# Patient Record
Sex: Female | Born: 1970 | Race: White | Hispanic: No | Marital: Married | Smoking: Never smoker
Health system: Southern US, Community
[De-identification: ages and names within clinical notes are randomized; demographics above are authoritative.]

## PROBLEM LIST (undated history)

## (undated) DIAGNOSIS — G43909 Migraine, unspecified, not intractable, without status migrainosus: Secondary | ICD-10-CM

## (undated) DIAGNOSIS — T7840XA Allergy, unspecified, initial encounter: Secondary | ICD-10-CM

## (undated) DIAGNOSIS — F419 Anxiety disorder, unspecified: Secondary | ICD-10-CM

## (undated) DIAGNOSIS — M1990 Unspecified osteoarthritis, unspecified site: Secondary | ICD-10-CM

## (undated) HISTORY — DX: Unspecified osteoarthritis, unspecified site: M19.90

## (undated) HISTORY — DX: Migraine, unspecified, not intractable, without status migrainosus: G43.909

## (undated) HISTORY — DX: Allergy, unspecified, initial encounter: T78.40XA

## (undated) HISTORY — DX: Anxiety disorder, unspecified: F41.9

---

## 1997-08-01 ENCOUNTER — Other Ambulatory Visit: Admission: RE | Admit: 1997-08-01 | Discharge: 1997-08-01 | Payer: Self-pay | Admitting: Gynecology

## 1998-09-03 ENCOUNTER — Other Ambulatory Visit: Admission: RE | Admit: 1998-09-03 | Discharge: 1998-09-03 | Payer: Self-pay | Admitting: Gynecology

## 1999-12-15 ENCOUNTER — Other Ambulatory Visit: Admission: RE | Admit: 1999-12-15 | Discharge: 1999-12-15 | Payer: Self-pay | Admitting: Gynecology

## 2000-11-15 ENCOUNTER — Other Ambulatory Visit: Admission: RE | Admit: 2000-11-15 | Discharge: 2000-11-15 | Payer: Self-pay | Admitting: Gynecology

## 2001-06-15 ENCOUNTER — Encounter: Admission: RE | Admit: 2001-06-15 | Discharge: 2001-06-15 | Payer: Self-pay | Admitting: Internal Medicine

## 2001-06-15 ENCOUNTER — Encounter: Payer: Self-pay | Admitting: Internal Medicine

## 2001-10-23 ENCOUNTER — Other Ambulatory Visit: Admission: RE | Admit: 2001-10-23 | Discharge: 2001-10-23 | Payer: Self-pay | Admitting: Gynecology

## 2002-01-12 ENCOUNTER — Ambulatory Visit (HOSPITAL_COMMUNITY): Admission: RE | Admit: 2002-01-12 | Discharge: 2002-01-12 | Payer: Self-pay | Admitting: Unknown Physician Specialty

## 2002-01-12 ENCOUNTER — Encounter: Payer: Self-pay | Admitting: Oral & Maxillofacial Surgery

## 2002-05-23 ENCOUNTER — Ambulatory Visit (HOSPITAL_COMMUNITY): Admission: RE | Admit: 2002-05-23 | Discharge: 2002-05-23 | Payer: Self-pay | Admitting: Oral & Maxillofacial Surgery

## 2003-01-25 ENCOUNTER — Other Ambulatory Visit: Admission: RE | Admit: 2003-01-25 | Discharge: 2003-01-25 | Payer: Self-pay | Admitting: Gynecology

## 2003-11-22 ENCOUNTER — Ambulatory Visit: Payer: Self-pay | Admitting: Family Medicine

## 2003-11-27 ENCOUNTER — Ambulatory Visit: Payer: Self-pay | Admitting: Family Medicine

## 2004-02-10 ENCOUNTER — Other Ambulatory Visit: Admission: RE | Admit: 2004-02-10 | Discharge: 2004-02-10 | Payer: Self-pay | Admitting: Obstetrics & Gynecology

## 2004-03-03 ENCOUNTER — Ambulatory Visit (HOSPITAL_COMMUNITY): Admission: RE | Admit: 2004-03-03 | Discharge: 2004-03-03 | Payer: Self-pay | Admitting: Obstetrics & Gynecology

## 2004-04-13 ENCOUNTER — Ambulatory Visit: Payer: Self-pay | Admitting: Internal Medicine

## 2004-06-03 ENCOUNTER — Ambulatory Visit: Payer: Self-pay | Admitting: Internal Medicine

## 2004-06-05 ENCOUNTER — Ambulatory Visit: Payer: Self-pay | Admitting: Internal Medicine

## 2004-10-20 ENCOUNTER — Ambulatory Visit: Payer: Self-pay | Admitting: Internal Medicine

## 2005-02-22 ENCOUNTER — Other Ambulatory Visit: Admission: RE | Admit: 2005-02-22 | Discharge: 2005-02-22 | Payer: Self-pay | Admitting: Obstetrics & Gynecology

## 2005-03-03 ENCOUNTER — Ambulatory Visit: Payer: Self-pay | Admitting: Internal Medicine

## 2005-03-15 ENCOUNTER — Ambulatory Visit: Payer: Self-pay | Admitting: Internal Medicine

## 2005-06-14 ENCOUNTER — Ambulatory Visit: Payer: Self-pay | Admitting: Internal Medicine

## 2005-11-22 ENCOUNTER — Ambulatory Visit: Payer: Self-pay | Admitting: Internal Medicine

## 2005-12-07 ENCOUNTER — Ambulatory Visit: Payer: Self-pay | Admitting: Internal Medicine

## 2005-12-09 ENCOUNTER — Ambulatory Visit: Payer: Self-pay | Admitting: Cardiovascular Disease

## 2008-10-03 ENCOUNTER — Inpatient Hospital Stay (HOSPITAL_COMMUNITY): Admission: AD | Admit: 2008-10-03 | Discharge: 2008-10-03 | Payer: Self-pay | Admitting: Obstetrics & Gynecology

## 2008-10-28 ENCOUNTER — Inpatient Hospital Stay (HOSPITAL_COMMUNITY): Admission: RE | Admit: 2008-10-28 | Discharge: 2008-10-30 | Payer: Self-pay | Admitting: Obstetrics and Gynecology

## 2008-11-20 ENCOUNTER — Ambulatory Visit: Admission: RE | Admit: 2008-11-20 | Discharge: 2008-11-20 | Payer: Self-pay | Admitting: Obstetrics & Gynecology

## 2010-04-09 LAB — CBC
HCT: 34.5 % — ABNORMAL LOW (ref 36.0–46.0)
Hemoglobin: 12 g/dL (ref 12.0–15.0)
MCHC: 34.7 g/dL (ref 30.0–36.0)
MCV: 96.5 fL (ref 78.0–100.0)
Platelets: 254 10*3/uL (ref 150–400)
Platelets: 265 10*3/uL (ref 150–400)
RBC: 2.75 MIL/uL — ABNORMAL LOW (ref 3.87–5.11)
RBC: 3.57 MIL/uL — ABNORMAL LOW (ref 3.87–5.11)
RDW: 13.5 % (ref 11.5–15.5)
WBC: 11.4 10*3/uL — ABNORMAL HIGH (ref 4.0–10.5)
WBC: 18 10*3/uL — ABNORMAL HIGH (ref 4.0–10.5)

## 2010-04-09 LAB — RPR: RPR Ser Ql: NONREACTIVE

## 2010-04-10 LAB — PROTIME-INR
INR: 0.9 (ref 0.00–1.49)
Prothrombin Time: 12 seconds (ref 11.6–15.2)

## 2010-04-10 LAB — CBC
HCT: 33 % — ABNORMAL LOW (ref 36.0–46.0)
Hemoglobin: 11.4 g/dL — ABNORMAL LOW (ref 12.0–15.0)
MCV: 96.6 fL (ref 78.0–100.0)
Platelets: 266 10*3/uL (ref 150–400)
RBC: 3.41 MIL/uL — ABNORMAL LOW (ref 3.87–5.11)
WBC: 11.7 10*3/uL — ABNORMAL HIGH (ref 4.0–10.5)

## 2010-04-10 LAB — APTT: aPTT: 27 seconds (ref 24–37)

## 2010-05-22 NOTE — Consult Note (Signed)
[name], Hardy                       ACCOUNT NO.:  [id number]   MEDICAL RECORD NO.:  [id number]                   PATIENT TYPE:  AMB   LOCATION:  DAY                                  FACILITY:  [facility]   PHYSICIAN:  Dorthula Matas, D.D.S.           DATE OF BIRTH:  Jun 22, 1970   DATE OF CONSULTATION:  05/23/2002  DATE OF DISCHARGE:                                   CONSULTATION   DAY SURGICAL NOTE   HISTORY OF PRESENT ILLNESS:  Julie Hardy is a 40 year old female who  is well-known to me.  I have seen her multiple times in the office, and she  has had complaints of left temporomandibular joint pain and locking.  Her  interincisal opening is limited to anywhere from 20-33 mm, depending on the  day.  We have tried splint therapy, and also have tried anti-inflammatory  medications and muscle relaxants to try to alleviate her pain.  She has also  modified her diet, but has not had any significant relief from the pain.  She had a magnetic resonant scan which showed temporomandibular joint  displacement bilaterally, but again, since her complaints are mainly on the  left side, we have decided just to address the left side.  Prior to  consenting to perform surgery, I placed some local anesthetic in the left  joint, and did give her significant pain relief.  At this point, she has  decided to proceed with left temporomandibular joint diagnostic and surgical  arthroscopy, and this will be done at [facility] day care.  She  does have an allergy to PENICILLIN, but otherwise she is in good general  health.   Our plan is to do the left temporomandibular joint arthroscopy and then  follow her postoperatively with physical therapy.  She is aware that she  will have to be off eating solid foods for approximately eight weeks.  I  have also talked to her about the significant risks with this surgery which  include, but are not limited to, the following:  swelling,  bruising,  bleeding, continued pain, possible need for further surgical care of  conservative temporomandibular joint care, infection, ankylosis, or fibrosis  of the joint, continued degeneration of the joint which may require further  care, possible damage to the facial nerve which might cause some paralysis,  either temporary or long term, on the left side of the face, possible  hearing changes or hearing loss, possible puncture of the external auditory  canal or tympanic membrane.  The patient is aware of these risks and desires  to proceed with the planned surgical care, which again will be accomplished  today at [facility].  Dorthula Matas, D.D.S.    SWS/MEDQ  D:  05/23/2002  T:  05/23/2002  Job:  [id number]

## 2010-05-22 NOTE — Op Note (Signed)
Julie Hardy, Julie Hardy                       ACCOUNT NO.:  [id number]   MEDICAL RECORD NO.:  [id number]                   PATIENT TYPE:  AMB   LOCATION:  DAY                                  FACILITY:  [facility]   PHYSICIAN:  Dorthula Matas, D.D.S.           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   PROCEDURE:  Left temporomandibular joint arthroscopy with holmium laser  assist.   SURGEON:  Dorthula Matas, D.D.S.   ANESTHESIA:  General anesthesia.   SPECIMENS:  None.   CULTURES:  None.   DRAINS:  Pack at the portal site.   COMPLICATIONS:  None.   DESCRIPTION OF PROCEDURE:  The patient is brought to the OR , placed on the  OR table in the supine position.  She was then placed under general  anesthesia and a nasotracheal tube was inserted.  The patient did have some  nasal bleeding, but it was not excessive.  Once the tube was placed, the  patient was maintained under general anesthesia and was prepped and draped  in a sterile manner for an oral, maxillofacial temporomandibular joint  arthroscopy procedure.  Prior to prepping a line was scribed from the left  lateral canthus of the eye to the mid-tragus area.  The patient was prepped  and then prepped and laser pads were placed over her eyes.  The patient's  head was turned so that the left side was up.  I first had the mandible  manipulated and felt the lateral aspect of the zygomatic arch and coronoid  process as well as the medial epicondyle.  I then placed a mark at the 10  and 2 mark and modified it slightly based on the palpation of the condyle  moving.  I then insufflated the superior joint space with approximately 2 mL  of lactated Ringer's.  I got good back pressure.  I then made a 2-3 mm  incision of just the skin  in an inferior-superior manner so that the  incision was vertical at the 10/2 approximate mark.  I then spread this with  a small mosquito until I felt I was close to the lateral rim of the glenoid  fossa.  I then placed a trocar with a sharp obturator in and immediately did  feel the lateral rim of the glenoid fossa.  Using a scribing motion superior  to inferior, I dropped over the inferior edge of the glenoid fossa and  pushed through the lateral capsule.  I kept the trocar oriented in a 10  degree up and a 10 degree anterior angulation throughout this.  Once I was  through the lateral capsule, I removed the obturator, which was sharp, and  replaced it with a blunt obturator.  The trocar  was further inserted into  the superior joint space.  I then removed the blunt obturator and did some  back washing with heparinized lactated Ringer's of the superior joint  space.  I then placed a needle outflow catheter of 20 gauge size  approximately 5 mm anterior and 5 mm inferior to the previously-described  incision.  I was then able to get through-and-through irrigation.  I placed  a scope after it had been white-balanced and identified and inspected the  superior joint space.  There was extreme hyperemia of the posterior lamina  area and the disc was noted to be dislocated anteriorly.  When I went into  the anterior pouch, again there was hyperemia noted and some adhesive-type  areas.  Also, there was marked softening of the lining of the glenoid fossa  and there was synovitis also identified.  At this point using a vector  technique, I placed the second trocar.  This was done using a vectoring  technique, but it was approximately at the 27/7 mark.  Again a 2-3 mm  vertical incision just through the skin was made and I then spread through  the tissue down to the articular eminence region using a mosquito hemostat.  I then placed the trocar with a sharp obturator, again encountered the  lateral  rim of the eminence or slightly anterior to the eminence, and then  using a scribing motion went beneath this bony landmark and pushed through  the lateral capsule into the superior joint space.  I could feel the other  scope as I advanced and once I was through the lateral capsule removed the  sharp obturator and placed the blunt obturator.  The scope was further  inserted.  The blunt obturator was removed, and I noted through-and-through  irrigation through the second trocar.  I placed a blunt switching stick and  was able to identify the placement of the second trocar.  Once this was  done, it was noted that there was a lot of inflammation in this anterior  pouch region and the holmium laser was placed.  I used the holmium laser at  different power settings to remove some of the synovium as well as to  decrease the hyperemia which was identified.  I then removed the laser and  placed the switching stick.  I then went back to the posterior pouch area.  Again the hyperemia was then addressed using the laser at a 0.2-0.5 joule  setting at approximately eight pulses.  Again the laser was used to reduce  the hyperemia and again also had some areas of loose bodies, which were  obliterated using the laser, and also some synovium where I had to do some  synovectomy.  Once this was completed, I did a lot of through-and-through  irrigation and I also found some adhesions, which I lysed using the laser,  and also with the switching stick.  I then copiously irrigated the joint  space and placed a milliliter of Celestone in the superior joint space.  I  removed both trocars and applied pressure laterally for a minute or two  prior to placing the interrupted sutures of 6-0 nylon.  In the anterior  trocar incision I placed one suture, in the posterior trocar incision I  placed two interrupted sutures.  At this point bacitracin ointment was applied.  I irrigated the external auditory canal with some  peroxide gently  and then with alcohol to dry it.  I inspected the external auditory  canal  and it was intact, and no bleeding  was noted in the external auditory  canal.  The tympanic membrane was difficult to visualize due to some cerumen  which was present.  At this point I placed Band-Aids over the incision  sites.  I then removed the drapes and manipulated the mandible.  The  mandible manipulated to approximately 44 of intra-incisal opening without  difficulty.  We suctioned out the oropharynx.  The small external auditory  canal cotton pellet had been removed previously.  The patient was awakened  in the OR and was transferred to the PACU area for follow-up and eventual  discharge from the day surgical area.                                               Dorthula Matas, D.D.S.    SWS/MEDQ  D:  05/23/2002  T:  05/23/2002  Job:  [id number]

## 2011-09-05 ENCOUNTER — Ambulatory Visit (INDEPENDENT_AMBULATORY_CARE_PROVIDER_SITE_OTHER): Payer: BC Managed Care – PPO | Admitting: Internal Medicine

## 2011-09-05 ENCOUNTER — Encounter: Payer: Self-pay | Admitting: Emergency Medicine

## 2011-09-05 VITALS — BP 137/77 | HR 69 | Temp 98.1°F | Resp 16 | Ht 65.0 in | Wt 146.0 lb

## 2011-09-05 DIAGNOSIS — R21 Rash and other nonspecific skin eruption: Secondary | ICD-10-CM

## 2011-09-05 DIAGNOSIS — B373 Candidiasis of vulva and vagina: Secondary | ICD-10-CM

## 2011-09-05 DIAGNOSIS — Z202 Contact with and (suspected) exposure to infections with a predominantly sexual mode of transmission: Secondary | ICD-10-CM

## 2011-09-05 DIAGNOSIS — IMO0002 Reserved for concepts with insufficient information to code with codable children: Secondary | ICD-10-CM

## 2011-09-05 LAB — POCT WET PREP WITH KOH
KOH Prep POC: POSITIVE
Trichomonas, UA: NEGATIVE
Yeast Wet Prep HPF POC: NEGATIVE

## 2011-09-05 MED ORDER — VALACYCLOVIR HCL 1 G PO TABS: 1000.0000 mg | ORAL_TABLET | Freq: Two times a day (BID) | ORAL | Status: AC

## 2011-09-05 MED ORDER — FLUCONAZOLE 150 MG PO TABS: 150.0000 mg | ORAL_TABLET | Freq: Once | ORAL | Status: AC

## 2011-09-05 MED ORDER — FLUOCINONIDE-E 0.05 % EX CREA: TOPICAL_CREAM | Freq: Two times a day (BID) | CUTANEOUS | Status: AC

## 2011-09-05 NOTE — Progress Notes (Signed)
  Subjective:    Patient ID: Julie Hardy, female    DOB: 1970-10-06, 41 y.o.   MRN: [id number]  HPIComplaining of vaginal swelling with irritation and itching and burning sensation since intercourse 5 days ago. 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 Review of Systems No fever chills or flank pain   No abdominal pain or pelvic pain Objective:   Physical Exam Vital signs stable without fever Introitus with red labia that are puffy without discrete lesions/ cottage cheese discharge Small irritation above clitoris it is very tender to scraping/not a clear ulceration  and no inguinal nodes     Results for orders placed in visit on 09/05/11  POCT WET PREP WITH KOH      Component Value Range   Trichomonas, UA Negative     Clue Cells Wet Prep HPF POC 0-1     Epithelial Wet Prep HPF POC 8-12     Yeast Wet Prep HPF POC neg     Bacteria Wet Prep HPF POC 3+     RBC Wet Prep HPF POC 0-2     WBC Wet Prep HPF POC 7-10     KOH Prep POC Positive      Assessment & Plan:  Problem #1 yeast vaginitis not responding to therapy-Will take 3 days in row Diflucan 150 Problem #2 painful skin lesion-HSV culture Problem #3 dyspareunia/lubrication/long-term= referred to [facility]"  To A&E for Lube  Meds ordered this encounter  Medications  . fluconazole (DIFLUCAN) 150 MG tablet    Sig: Take 1 tablet (150 mg total) by mouth once.    Dispense:  1 tablet    Refill:  0  . fluocinonide-emollient (LIDEX-E) 0.05  % cream    Sig: Apply topically 2 (two) times daily.    Dispense:  30 g    Refill:  0  . valACYclovir (VALTREX) 1000 MG tablet    Sig: Take 1 tablet (1,000 mg total) by mouth 2 (two) times daily.    Dispense:  14 tablet    Refill:  0   Call results

## 2011-09-07 LAB — GC/CHLAMYDIA PROBE AMP, URINE
Chlamydia, Swab/Urine, PCR: NEGATIVE
GC Probe Amp, Urine: NEGATIVE

## 2011-09-08 LAB — HERPES SIMPLEX VIRUS CULTURE: Organism ID, Bacteria: NOT DETECTED

## 2012-05-22 LAB — CBC AND DIFFERENTIAL
HCT: 41 (ref 36–46)
Hemoglobin: 13.5 (ref 12.0–16.0)
Neutrophils Absolute: 5
PLATELETS: 267 (ref 150–399)
WBC: 8.5

## 2012-05-22 LAB — BASIC METABOLIC PANEL
BUN: 13 (ref 4–21)
CREATININE: 0.6 (ref 0.5–1.1)
GLUCOSE: 52
Potassium: 4.9 (ref 3.4–5.3)
Sodium: 140 (ref 137–147)

## 2012-05-22 LAB — HEPATIC FUNCTION PANEL
ALK PHOS: 55 (ref 25–125)
ALT: 14 (ref 7–35)
AST: 14 (ref 13–35)
Bilirubin, Total: 0.4

## 2012-05-31 ENCOUNTER — Other Ambulatory Visit: Payer: Self-pay | Admitting: Gastroenterology

## 2012-05-31 DIAGNOSIS — K219 Gastro-esophageal reflux disease without esophagitis: Secondary | ICD-10-CM

## 2012-06-13 ENCOUNTER — Ambulatory Visit
Admission: RE | Admit: 2012-06-13 | Discharge: 2012-06-13 | Disposition: A | Discharge status: Home / Self Care | Payer: BC Managed Care – PPO | Source: Ambulatory Visit | Attending: Gastroenterology | Admitting: Gastroenterology

## 2012-06-13 DIAGNOSIS — K219 Gastro-esophageal reflux disease without esophagitis: Secondary | ICD-10-CM

## 2017-03-11 ENCOUNTER — Emergency Department (HOSPITAL_COMMUNITY)
Admission: EM | Admit: 2017-03-11 | Discharge: 2017-03-11 | Disposition: A | Discharge status: Home / Self Care | Payer: BC Managed Care – PPO | Attending: Emergency Medicine | Admitting: Emergency Medicine

## 2017-03-11 ENCOUNTER — Encounter (HOSPITAL_COMMUNITY): Payer: Self-pay | Admitting: Emergency Medicine

## 2017-03-11 ENCOUNTER — Emergency Department (HOSPITAL_COMMUNITY): Payer: BC Managed Care – PPO

## 2017-03-11 ENCOUNTER — Other Ambulatory Visit: Payer: Self-pay

## 2017-03-11 DIAGNOSIS — R0789 Other chest pain: Secondary | ICD-10-CM | POA: Diagnosis not present

## 2017-03-11 DIAGNOSIS — R5383 Other fatigue: Secondary | ICD-10-CM | POA: Diagnosis not present

## 2017-03-11 DIAGNOSIS — R5381 Other malaise: Secondary | ICD-10-CM | POA: Diagnosis not present

## 2017-03-11 LAB — BASIC METABOLIC PANEL
Anion gap: 7 (ref 5–15)
BUN: 16 mg/dL (ref 6–20)
CALCIUM: 9.1 mg/dL (ref 8.9–10.3)
CO2: 27 mmol/L (ref 22–32)
Chloride: 107 mmol/L (ref 101–111)
Creatinine, Ser: 0.87 mg/dL (ref 0.44–1.00)
GFR calc Af Amer: >60 mL/min (ref >60)
Glucose, Bld: 83 mg/dL (ref 65–99)
Potassium: 4 mmol/L (ref 3.5–5.1)
SODIUM: 141 mmol/L (ref 135–145)

## 2017-03-11 LAB — I-STAT BETA HCG BLOOD, ED (MC, WL, AP ONLY): I-stat hCG, quantitative: <5 m[IU]/mL (ref <5)

## 2017-03-11 LAB — CBC
HCT: 37.2 % (ref 36.0–46.0)
Hemoglobin: 12.8 g/dL (ref 12.0–15.0)
MCH: 31.9 pg (ref 26.0–34.0)
MCHC: 34.4 g/dL (ref 30.0–36.0)
MCV: 92.8 fL (ref 78.0–100.0)
Platelets: 246 10*3/uL (ref 150–400)
RBC: 4.01 MIL/uL (ref 3.87–5.11)
RDW: 12.5 % (ref 11.5–15.5)
WBC: 8.8 10*3/uL (ref 4.0–10.5)

## 2017-03-11 LAB — I-STAT TROPONIN, ED: TROPONIN I, POC: 0 ng/mL (ref 0.00–0.08)

## 2017-03-11 MED ORDER — KETOROLAC TROMETHAMINE 15 MG/ML IJ SOLN
15.0000 mg | Freq: Once | INTRAMUSCULAR | Status: AC
  Administered 2017-03-11: 15 mg via INTRAVENOUS
  Filled 2017-03-11: qty 1

## 2017-03-11 NOTE — ED Notes (Signed)
Patient given discharge instructions and verbalized understanding.  Patient stable to discharge at this time.  Patient is alert and oriented to baseline.  No distressed noted at this time.  All belongings taken with the patient at discharge.   

## 2017-03-11 NOTE — ED Triage Notes (Signed)
Patient arrived to triage via [facility]. Started having chest pain this morning before work and it got worse throughout the day. Also c/o of SHOB, lightheaded.

## 2017-03-11 NOTE — ED Notes (Signed)
[facility]

## 2017-03-11 NOTE — ED Provider Notes (Signed)
[facility] EMERGENCY DEPARTMENT Provider Note   CSN: [id number] Arrival date & time: 03/11/17  1508     History   Chief Complaint Chief Complaint  Patient presents with  . Chest Pain    HPI Julie Hardy is a 47 y.o. female.  47 year old female without significant medical history presents with complaint of malaise and fatigue times 3 weeks.  Patient reports that she has been feeling tired and under the weather for the last months.  She is employed as a principal of an Engineer, petroleum.  She has multiple exposures to sick children on a daily basis.  Today she noticed vague left parasternal chest discomfort.  This is been present all day.  The pain is dull and constant.  It is worse with a deep breath or movement of the chest wall.  She denies associated shortness of breath, nausea, vomiting, diaphoresis, or other acute complaint.     The history is provided by the patient.  Chest Pain   The current episode started 12 to 24 hours ago. The problem occurs rarely. The problem has not changed since onset.The pain is associated with movement. Pain location: left parasternal costochondral margin  The pain is mild. The quality of the pain is described as pressure-like. The pain does not radiate. The symptoms are aggravated by certain positions. She has tried nothing for the symptoms. There are no known risk factors.    History reviewed. No pertinent past medical history.  There are no active problems to display for this patient.   History reviewed. No pertinent surgical history.  OB History    No data available       Home Medications    Prior to Admission medications   Medication Sig Start Date End Date Taking? Authorizing Provider  Artificial Tear Ointment (DRY EYES OP) Apply 1 drop to eye as needed.   Yes [provider]  ibuprofen (ADVIL) 200 MG tablet Take 400 mg by mouth at bedtime.   Yes [provider]  loratadine (CLARITIN) 10  MG tablet Take 10 mg by mouth at bedtime.   Yes [provider]    Family History History reviewed. No pertinent family history.  Social History Social History   Tobacco Use  . Smoking status: Never Smoker  . Smokeless tobacco: Never Used  Substance Use Topics  . Alcohol use: Yes    Alcohol/week: 4.2 oz    Types: 7 Glasses of wine per week    Comment: social  . Drug use: No     Allergies   Amoxicillin and Chocolate flavor   Review of Systems Review of Systems  Constitutional: Positive for fatigue.  Cardiovascular: Positive for chest pain.  All other systems reviewed and are negative.    Physical Exam Updated Vital Signs BP 131/86 (BP Location: Right Arm)   Pulse 67   Temp 98.6 F (37 C) (Oral)   Resp 16   Ht 5\' 4"  (1.626 m)   Wt 68.9 kg (152 lb)   SpO2 100%   BMI 26.09 kg/m   Physical Exam  Constitutional: She is oriented to person, place, and time. She appears well-developed and well-nourished. No distress.  HENT:  Head: Normocephalic and atraumatic.  Mouth/Throat: Oropharynx is clear and moist.  Eyes: Conjunctivae and EOM are normal. Pupils are equal, round, and reactive to light.  Neck: Normal range of motion. Neck supple.  Cardiovascular: Normal rate, regular rhythm and normal heart sounds.  Tenderness to the left parasternal costochondral  margin. This reproduces the patient's reported pain exactly.  Pulmonary/Chest: Effort normal and breath sounds normal. No respiratory distress.  Abdominal: Soft. She exhibits no distension. There is no tenderness.  Musculoskeletal: Normal range of motion. She exhibits no edema or deformity.  Neurological: She is alert and oriented to person, place, and time.  Skin: Skin is warm and dry.  Psychiatric: She has a normal mood and affect.  Nursing note and vitals reviewed.    ED Treatments / Results  Labs (all labs ordered are listed, but only abnormal results are displayed) Labs Reviewed  BASIC  METABOLIC PANEL  CBC  TROPONIN I  TSH  I-STAT TROPONIN, ED  I-STAT BETA HCG BLOOD, ED ([facility], [facility], [facility] ONLY)  I-STAT TROPONIN, ED    EKG  EKG Interpretation  Date/Time:  Friday March 11 2017 15:13:19 EST Ventricular Rate:  78 PR Interval:  138 QRS Duration: 64 QT Interval:  360 QTC Calculation: 410 R Axis:   22 Text Interpretation:  Normal sinus rhythm Normal ECG Confirmed by Kristine RoyalMessick, Peter [phone]([id number]) on 03/11/2017 5:53:21 PM       Radiology Dg Chest 2 View  Result Date: 03/11/2017 CLINICAL DATA:  47 y/o F; worsening chest pain, shortness of breath, lightheadedness. EXAM: CHEST - 2 VIEW COMPARISON:  None. FINDINGS: The heart size and mediastinal contours are within normal limits. Both lungs are clear. The visualized skeletal structures are unremarkable. IMPRESSION: No acute pulmonary process identified. Electronically Signed   By: Mitzi HansenLance  Furusawa-Stratton M.D.   On: 03/11/2017 16:09    Procedures Procedures (including critical care time)  Medications Ordered in ED Medications  ketorolac (TORADOL) 15 MG/ML injection 15 mg (15 mg Intravenous Given 03/11/17 1831)     Initial Impression / Assessment and Plan / ED Course  I have reviewed the triage vital signs and the nursing notes.  Pertinent labs & imaging results that were available during my care of the patient were reviewed by me and considered in my medical decision making (see chart for details).     MDM  Screen complete  Patient is presenting with atypical chest pain.  The pain is reproduced with palpation of the left costochondral margin.  EKG is normal without suggestion of acute ischemia.  Chest x-ray is without significant findings.  Screening laboratory evaluations do not suggest other acute pathologies.  Patient's presentation is not suggestive of ACS.  Patient feels improved following administration of Toradol in the ED.  She desires discharge home.  She declines further observation or workup at this time.  Close  follow-up is advised.  Strict return precautions given and understood.   Patient understands a TSH screen is still pending - she desires discharge home and does not desire to wait for this result.  She will contact her PMD on Monday in order to obtain the results of this test.   Final Clinical Impressions(s) / ED Diagnoses   Final diagnoses:  Atypical chest pain  Chest wall pain    ED Discharge Orders    None       Wynetta FinesMessick, Peter C, MD 03/11/17 1940

## 2017-03-14 ENCOUNTER — Telehealth: Payer: Self-pay

## 2017-03-14 NOTE — Telephone Encounter (Signed)
-----   Message from Wendall StadePeter C Nishan, MD sent at 03/11/2017  5:14 PM EST ----- Have her see me as new patient ASAP seen in ER 03/11/17 for chest pain dad is a patient of mine

## 2017-03-14 NOTE — Telephone Encounter (Signed)
Left message for patient to call back  

## 2017-03-14 NOTE — Telephone Encounter (Signed)
Patient has an appointment tomorrow with Dr. Eden EmmsNishan.

## 2017-03-14 NOTE — Progress Notes (Signed)
Cardiology Office Note   Date:  03/15/2017   ID:  Julie LopeMeredith E Cantero, DOB 12/28/1970, MRN [id number]  PCP:  Dorothyann PengSanders, Robyn, MD  Cardiologist:   Charlton HawsPeter Marliyah Reid, MD   No chief complaint on file.     History of Present Illness: Julie Hardy is a 47 y.o. female who presents for consultation regarding chest pain referred by Kristine RoyalPeter Messick Treasure. Patient's father Mr Ida RogueKenneth Roberson is a patient of mine She was seen in ER 03/11/17 for atypical chest pain. 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 Non smoker no real PMH. Drinks a daily glass of wine ER notes indicate pain reproducible to palpation of left costochondral margin Rx with Toradol in ED with improvement R/O with normal CBC and BMET  CXR NAD ECG normal   Pain seems more pleuritic now. She takes two motrin routinely at night. Has one daughter age 268 Married 16 years no excess ETOH Non smoker Due to see primary Has severe fatigue     History reviewed. No pertinent past medical history.  History reviewed. No pertinent surgical history.   Current Outpatient Medications  Medication Sig Dispense Refill  . Artificial Tear Ointment (DRY EYES OP) Apply 1 drop to eye as needed.    Marland Kitchen. ibuprofen (ADVIL) 200 MG tablet Take 400 mg by mouth at bedtime.    Marland Kitchen. loratadine (CLARITIN) 10 MG tablet Take 10 mg by mouth at bedtime.     No current facility-administered medications for this visit.     Allergies:   Amoxicillin and Chocolate flavor    Social History:  The patient  reports that  has never smoked. she has never used smokeless tobacco. She reports that she drinks about 4.2 oz of alcohol per week. She reports that she does not use drugs.   Family History:  The patient's family history is not on file.    ROS:  Please see the history of present illness.   Otherwise, review of systems are positive for none.   All other  systems are reviewed and negative.    PHYSICAL EXAM: VS:  BP 128/80   Pulse 68   Ht 5\' 4"  (1.626 m)   Wt 153 lb (69.4 kg)   BMI 26.26 kg/m  , BMI Body mass index is 26.26 kg/m. Affect appropriate Healthy:  appears stated age HEENT: normal Neck supple with no adenopathy JVP normal no bruits no thyromegaly Lungs clear with no wheezing and good diaphragmatic motion Heart:  S1/S2 no murmur, no rub, gallop or click PMI normal Abdomen: benighn, BS positve, no tenderness, no AAA no bruit.  No HSM or HJR Distal pulses intact with no bruits No edema Neuro non-focal Skin warm and dry No muscular weakness    EKG:  03/12/17 NSR rate 78 normal ECG    Recent Labs: 03/11/2017: BUN 16; Creatinine, Ser 0.87; Hemoglobin 12.8; Platelets 246; Potassium 4.0; Sodium 141    Lipid Panel No results found for: CHOL, TRIG, HDL, CHOLHDL, VLDL, LDLCALC, LDLDIRECT    Wt Readings from Last 3 Encounters:  03/15/17 153 lb (69.4 kg)  03/11/17 152 lb (68.9 kg)  09/05/11 146 lb (66.2 kg)      Other studies Reviewed: Additional studies/ records that were reviewed today include: ER notes , CXR, labs and ECG .    ASSESSMENT AND PLAN:  1.Chest Pain:  Atypical ER evaluation normal Normal exam and  ECG f/u ETT 2. Anxiety/Stress: related to job f/u primary stress relief measures other than ETOH 3. Fatigue CBC/BMET normal make sure primary checks TSH/T4    Current medicines are reviewed at length with the patient today.  The patient does not have concerns regarding medicines.  The following changes have been made:  no change  Labs/ tests ordered today include: ETT   Orders Placed This Encounter  Procedures  . EXERCISE TOLERANCE TEST (ETT)     Disposition:   FU with cardiology PRN      Signed, Charlton Haws, MD  03/15/2017 3:00 PM    [facility] [address] [place], [place], [place]  [address] Phone: [phone]; Fax: [phone]

## 2017-03-15 ENCOUNTER — Encounter: Payer: Self-pay | Admitting: Cardiovascular Disease

## 2017-03-15 ENCOUNTER — Ambulatory Visit: Payer: BC Managed Care – PPO | Admitting: Cardiovascular Disease

## 2017-03-15 VITALS — BP 128/80 | HR 68 | Ht 64.0 in | Wt 153.0 lb

## 2017-03-15 DIAGNOSIS — R079 Chest pain, unspecified: Secondary | ICD-10-CM | POA: Diagnosis not present

## 2017-03-15 NOTE — Patient Instructions (Addendum)
Medication Instructions:  Your physician recommends that you continue on your current medications as directed. Please refer to the Current Medication list given to you today.  Labwork: NONE  Testing/Procedures: Your physician has requested that you have an exercise tolerance test. For further information please visit [email or web address]. Please also follow instruction sheet, as given.  Follow-Up: Your physician wants you to follow-up as needed with Dr. Nishan.    If you need a refill on your cardiac medications before your next appointment, please call your pharmacy.    

## 2017-03-17 ENCOUNTER — Encounter: Payer: Self-pay | Admitting: Family Medicine

## 2017-03-17 ENCOUNTER — Ambulatory Visit: Payer: BC Managed Care – PPO | Admitting: Family Medicine

## 2017-03-17 VITALS — BP 110/80 | HR 87 | Ht 64.0 in | Wt 154.8 lb

## 2017-03-17 DIAGNOSIS — E559 Vitamin D deficiency, unspecified: Secondary | ICD-10-CM | POA: Insufficient documentation

## 2017-03-17 DIAGNOSIS — F439 Reaction to severe stress, unspecified: Secondary | ICD-10-CM | POA: Insufficient documentation

## 2017-03-17 DIAGNOSIS — R0683 Snoring: Secondary | ICD-10-CM

## 2017-03-17 DIAGNOSIS — F5101 Primary insomnia: Secondary | ICD-10-CM

## 2017-03-17 MED ORDER — TRAZODONE HCL 50 MG PO TABS: 25.0000 mg | ORAL_TABLET | Freq: Every evening | ORAL | 3 refills | Status: DC | PRN

## 2017-03-17 MED ORDER — VITAMIN D (ERGOCALCIFEROL) 1.25 MG (50000 UNIT) PO CAPS: 50000.0000 [IU] | ORAL_CAPSULE | ORAL | 0 refills | Status: DC

## 2017-03-17 NOTE — Progress Notes (Addendum)
Subjective:  Patient ID: Julie Hardy, female    DOB: 1971/01/02  Age: 47 y.o. MRN: [id number]  CC: New Patient (Initial Visit)   HPI Julie Hardy presents for follow-up status post ER visit for acute chest pain 1 week ago.  EKG and serial troponins were negative.  She was diagnosed with costochondritis.  She followed up with a cardiologist 3 days later.  EKG performed in his office was normal as well.  ETT has been scheduled for next week.  Patient is accompanied by her 70 year old mother who is a breast cancer survivor.  Diabetes and osteo arthritis and hypothyroidism one on the mother side.   father's health history is unknown.  Patient brings in lab work drawn by her GYN provider.  CMP and CBC were normal.  Hemoglobin A1c was 5.1.  Patient has no history of elevated glucose but diabetes needed to be ruled out secondary to fatigue.  LDL cholesterol was 99 HDL of 60.  TSH was 1.78.  Vitamin D was low at 21.7.  Patient tells of poor sleep quality for quite some time now.  She typically sleeps no more than 3-4 hours a night.  She does snore some and does not feel rested when she does get up in the morning.  Her husband is a heavy snorer.  Elementary school principal.  She is the mother of an 15-year-old daughter.  Things are okay at home but she does admit that she is responsible for most of the household chores basis.  She has 1 glass of wine daily.  She does not smoke or use illicit drugs.  She averages 12 thousand steps daily while on her job.  She admits to an overwhelming sense of fatigue.  Patient's mother tells me that patient has fallen asleep in her car in patient's driveway.  Recent Pap and pelvic exams have been normal.  Recent mammogram was normal.  Outpatient Medications Prior to Visit  Medication Sig Dispense Refill  . ibuprofen (ADVIL) 200 MG tablet Take 400 mg by mouth at bedtime.    Marland Kitchen loratadine (CLARITIN) 10 MG tablet Take 10 mg by mouth at bedtime.    . Artificial Tear  Ointment (DRY EYES OP) Apply 1 drop to eye as needed.     No facility-administered medications prior to visit.     ROS Review of Systems  Constitutional: Positive for fatigue. Negative for activity change, chills, fever and unexpected weight change.  HENT: Negative.   Eyes: Negative.   Respiratory: Negative.   Cardiovascular: Negative.   Gastrointestinal: Negative.  Negative for abdominal pain, blood in stool, nausea and vomiting.  Genitourinary: Negative.   Musculoskeletal: Negative.   Skin: Negative for color change and rash.  Allergic/Immunologic: Negative for immunocompromised state.  Hematological: Does not bruise/bleed easily.  Psychiatric/Behavioral: Positive for sleep disturbance. Negative for self-injury. The patient is nervous/anxious.     Objective:  BP 110/80 (BP Location: Right Arm, Patient Position: Sitting, Cuff Size: Normal)   Pulse 87   Ht 5\' 4"  (1.626 m)   Wt 154 lb 12.8 oz (70.2 kg)   BMI 26.57 kg/m   BP Readings from Last 3 Encounters:  03/17/17 110/80  03/15/17 128/80  03/11/17 (!) 145/96    Wt Readings from Last 3 Encounters:  03/17/17 154 lb 12.8 oz (70.2 kg)  03/15/17 153 lb (69.4 kg)  03/11/17 152 lb (68.9 kg)    Physical Exam  Constitutional: She is oriented to person, place, and time. She appears well-developed and  well-nourished. No distress.  HENT:  Head: Normocephalic and atraumatic.  Right Ear: External ear normal.  Left Ear: External ear normal.  Mouth/Throat: Oropharynx is clear and moist. No oropharyngeal exudate.    Eyes: Conjunctivae are normal. Pupils are equal, round, and reactive to light. Right eye exhibits no discharge. Left eye exhibits no discharge. No scleral icterus.  Neck: Neck supple. No JVD present. No tracheal deviation present. No thyromegaly present.  Cardiovascular: Normal rate, regular rhythm and normal heart sounds.  Pulmonary/Chest: Effort normal and breath sounds normal. No stridor.  Abdominal: Bowel sounds  are normal. She exhibits no distension. There is no tenderness. There is no rebound and no guarding.  Lymphadenopathy:    She has no cervical adenopathy.  Neurological: She is alert and oriented to person, place, and time.  Skin: Skin is warm and dry. She is not diaphoretic.  Psychiatric: She has a normal mood and affect. Her behavior is normal.   Dep[facility] screen [facility] 03/17/2017  Decreased Interest 0  Down, Depressed, Hopeless 1  PHQ - 2 Score 1  Altered sleeping 3  Tired, decreased energy 3  Change in appetite 2  Feeling bad or failure about yourself  1  Trouble concentrating 1  Moving slowly or fidgety/restless 1  Suicidal thoughts 0  PHQ-9 Score 12    Lab Results  Component Value Date   WBC 8.8 03/11/2017   HGB 12.8 03/11/2017   HCT 37.2 03/11/2017   PLT 246 03/11/2017   GLUCOSE 83 03/11/2017   NA 141 03/11/2017   K 4.0 03/11/2017   CL 107 03/11/2017   CREATININE 0.87 03/11/2017   BUN 16 03/11/2017   CO2 27 03/11/2017   INR 0.9 10/03/2008    Dg Chest 2 View  Result Date: 03/11/2017 CLINICAL DATA:  47 y/o F; worsening chest pain, shortness of breath, lightheadedness. EXAM: CHEST - 2 VIEW COMPARISON:  None. FINDINGS: The heart size and mediastinal contours are within normal limits. Both lungs are clear. The visualized skeletal structures are unremarkable. IMPRESSION: No acute pulmonary process identified. Electronically Signed   By: Mitzi HansenLance  Furusawa-Stratton M.D.   On: 03/11/2017 16:09    Assessment & Plan:   Julie Hardy was seen today for new patient (initial visit).  Diagnoses and all orders for this visit:  Stress -     Urinalysis, Routine w reflex microscopic  Snores -     Ambulatory referral to Sleep Studies  Vitamin D deficiency -     Vitamin D, Ergocalciferol, (DRISDOL) 50000 units CAPS capsule; Take 1 capsule (50,000 Units total) by mouth every 7 (seven) days.  Primary insomnia -     traZODone (DESYREL) 50 MG tablet; Take 0.5-1 tablets (25-50 mg  total) by mouth at bedtime as needed for sleep.   I am having Julie Hardy start on Vitamin D (Ergocalciferol) and traZODone. I am also having her maintain her ibuprofen, loratadine, and Artificial Tear Ointment (DRY EYES OP).  Meds ordered this encounter  Medications  . Vitamin D, Ergocalciferol, (DRISDOL) 50000 units CAPS capsule    Sig: Take 1 capsule (50,000 Units total) by mouth every 7 (seven) days.    Dispense:  30 capsule    Refill:  0  . traZODone (DESYREL) 50 MG tablet    Sig: Take 0.5-1 tablets (25-50 mg total) by mouth at bedtime as needed for sleep.    Dispense:  30 tablet    Refill:  3   Patient did have a significant PHQ 9 score I do  believe that the stress of her job coupled with poor sleep could have a lot to do with her current issues.  She does snore and was surprised to find her high Mallampati score.  I do think a consultation is in order for possible sleep study.  I think part of her problem is her husband's apparent sleep apnea and I encouraged her to have him evaluated.  Sleep hygiene was discussed.  We will try trazodone.  Weekly high-dose vitamin D was prescribed vitamin D deficiency.  She will also try vitamin B complex.  She will follow-up in 1 month  Follow-up: Return in about 1 month (around 04/17/2017).  Mliss Sax, MD

## 2017-03-17 NOTE — Patient Instructions (Addendum)
Vitamin D Deficiency °Vitamin D deficiency is when your body does not have enough vitamin D. Vitamin D is important to your body for many reasons: °· It helps the body to absorb two important minerals, called calcium and phosphorus. °· It plays a role in bone health. °· It may help to prevent some diseases, such as diabetes and multiple sclerosis. °· It plays a role in muscle function, including heart function. ° °You can get vitamin D by: °· Eating foods that naturally contain vitamin D. °· Eating or drinking milk or other dairy products that have vitamin D added to them. °· Taking a vitamin D supplement or a multivitamin supplement that contains vitamin D. °· Being in the sun. Your body naturally makes vitamin D when your skin is exposed to sunlight. Your body changes the sunlight into a form of the vitamin that the body can use. ° °If vitamin D deficiency is severe, it can cause a condition in which your bones become soft. In adults, this condition is called osteomalacia. In children, this condition is called rickets. °What are the causes? °Vitamin D deficiency may be caused by: °· Not eating enough foods that contain vitamin D. °· Not getting enough sun exposure. °· Having certain digestive system diseases that make it difficult for your body to absorb vitamin D. These diseases include Crohn disease, chronic pancreatitis, and cystic fibrosis. °· Having a surgery in which a part of the stomach or a part of the small intestine is removed. °· Being obese. °· Having chronic kidney disease or liver disease. ° °What increases the risk? °This condition is more likely to develop in: °· Older people. °· People who do not spend much time outdoors. °· People who live in a long-term care facility. °· People who have had broken bones. °· People with weak or thin bones (osteoporosis). °· People who have a disease or condition that changes how the body absorbs vitamin D. °· People who have dark skin. °· People who take certain  medicines, such as steroid medicines or certain seizure medicines. °· People who are overweight or obese. ° °What are the signs or symptoms? °In mild cases of vitamin D deficiency, there may not be any symptoms. If the condition is severe, symptoms may include: °· Bone pain. °· Muscle pain. °· Falling often. °· Broken bones caused by a minor injury. ° °How is this diagnosed? °This condition is usually diagnosed with a blood test. °How is this treated? °Treatment for this condition may depend on what caused the condition. Treatment options include: °· Taking vitamin D supplements. °· Taking a calcium supplement. Your health care provider will suggest what dose is best for you. ° °Follow these instructions at home: °· Take medicines and supplements only as told by your health care provider. °· Eat foods that contain vitamin D. Choices include: °? Fortified dairy products, cereals, or juices. Fortified means that vitamin D has been added to the food. Check the label on the package to be sure. °? Fatty fish, such as salmon or trout. °? Eggs. °? Oysters. °· Do not use a tanning bed. °· Maintain a healthy weight. Lose weight, if needed. °· Keep all follow-up visits as told by your health care provider. This is important. °Contact a health care provider if: °· Your symptoms do not go away. °· You feel like throwing up (nausea) or you throw up (vomit). °· You have fewer bowel movements than usual or it is difficult for you to have a   bowel movement (constipation). This information is not intended to replace advice given to you by your health care provider. Make sure you discuss any questions you have with your health care provider. Document Released: 03/15/2011 Document Revised: 06/04/2015 Document Reviewed: 05/08/2014 Elsevier Interactive Patient Education  2018 [employer] Insomnia Insomnia is a sleep disorder that makes it difficult to fall asleep or to stay asleep. Insomnia can cause tiredness (fatigue), low  energy, difficulty concentrating, mood swings, and poor performance at work or school. There are three different ways to classify insomnia:  Difficulty falling asleep.  Difficulty staying asleep.  Waking up too early in the morning.  Any type of insomnia can be long-term (chronic) or short-term (acute). Both are common. Short-term insomnia usually lasts for three months or less. Chronic insomnia occurs at least three times a week for longer than three months. What are the causes? Insomnia may be caused by another condition, situation, or substance, such as:  Anxiety.  Certain medicines.  Gastroesophageal reflux disease (GERD) or other gastrointestinal conditions.  Asthma or other breathing conditions.  Restless legs syndrome, sleep apnea, or other sleep disorders.  Chronic pain.  Menopause. This may include hot flashes.  Stroke.  Abuse of alcohol, tobacco, or illegal drugs.  Depression.  Caffeine.  Neurological disorders, such as Alzheimer disease.  An overactive thyroid (hyperthyroidism).  The cause of insomnia may not be known. What increases the risk? Risk factors for insomnia include:  Gender. Women are more commonly affected than men.  Age. Insomnia is more common as you get older.  Stress. This may involve your professional or personal life.  Income. Insomnia is more common in people with lower income.  Lack of exercise.  Irregular work schedule or night shifts.  Traveling between different time zones.  What are the signs or symptoms? If you have insomnia, trouble falling asleep or trouble staying asleep is the main symptom. This may lead to other symptoms, such as:  Feeling fatigued.  Feeling nervous about going to sleep.  Not feeling rested in the morning.  Having trouble concentrating.  Feeling irritable, anxious, or depressed.  How is this treated? Treatment for insomnia depends on the cause. If your insomnia is caused by an underlying  condition, treatment will focus on addressing the condition. Treatment may also include:  Medicines to help you sleep.  Counseling or therapy.  Lifestyle adjustments.  Follow these instructions at home:  Take medicines only as directed by your health care provider.  Keep regular sleeping and waking hours. Avoid naps.  Keep a sleep diary to help you and your health care provider figure out what could be causing your insomnia. Include: ? When you sleep. ? When you wake up during the night. ? How well you sleep. ? How rested you feel the next day. ? Any side effects of medicines you are taking. ? What you eat and drink.  Make your bedroom a comfortable place where it is easy to fall asleep: ? Put up shades or special blackout curtains to block light from outside. ? Use a white noise machine to block noise. ? Keep the temperature cool.  Exercise regularly as directed by your health care provider. Avoid exercising right before bedtime.  Use relaxation techniques to manage stress. Ask your health care provider to suggest some techniques that may work well for you. These may include: ? Breathing exercises. ? Routines to release muscle tension. ? Visualizing peaceful scenes.  Cut back on alcohol, caffeinated beverages, and cigarettes, especially close  to bedtime. These can disrupt your sleep.  Do not overeat or eat spicy foods right before bedtime. This can lead to digestive discomfort that can make it hard for you to sleep.  Limit screen use before bedtime. This includes: ? Watching TV. ? Using your smartphone, tablet, and computer.  Stick to a routine. This can help you fall asleep faster. Try to do a quiet activity, brush your teeth, and go to bed at the same time each night.  Get out of bed if you are still awake after 15 minutes of trying to sleep. Keep the lights down, but try reading or doing a quiet activity. When you feel sleepy, go back to bed.  Make sure that you  drive carefully. Avoid driving if you feel very sleepy.  Keep all follow-up appointments as directed by your health care provider. This is important. Contact a health care provider if:  You are tired throughout the day or have trouble in your daily routine due to sleepiness.  You continue to have sleep problems or your sleep problems get worse. Get help right away if:  You have serious thoughts about hurting yourself or someone else. This information is not intended to replace advice given to you by your health care provider. Make sure you discuss any questions you have with your health care provider. Document Released: 12/19/1999 Document Revised: 05/23/2015 Document Reviewed: 09/21/2013 Elsevier Interactive Patient Education  2018 [employer]. Trazodone tablets What is this medicine? TRAZODONE (TRAZ oh done) is used to treat depression. This medicine may be used for other purposes; ask your health care provider or pharmacist if you have questions. COMMON BRAND NAME(S): Desyrel What should I tell my health care provider before I take this medicine? They need to know if you have any of these conditions: -attempted suicide or thinking about it -bipolar disorder -bleeding problems -glaucoma -heart disease, or previous heart attack -irregular heart beat -kidney or liver disease -low levels of sodium in the blood -an unusual or allergic reaction to trazodone, other medicines, foods, dyes or preservatives -pregnant or trying to get pregnant -breast-feeding How should I use this medicine? Take this medicine by mouth with a glass of water. Follow the directions on the prescription label. Take this medicine shortly after a meal or a light snack. Take your medicine at regular intervals. Do not take your medicine more often than directed. Do not stop taking this medicine suddenly except upon the advice of your doctor. Stopping this medicine too quickly may cause serious side effects or your  condition may worsen. A special MedGuide will be given to you by the pharmacist with each prescription and refill. Be sure to read this information carefully each time. Talk to your pediatrician regarding the use of this medicine in children. Special care may be needed. Overdosage: If you think you have taken too much of this medicine contact a poison control center or emergency room at once. NOTE: This medicine is only for you. Do not share this medicine with others. What if I miss a dose? If you miss a dose, take it as soon as you can. If it is almost time for your next dose, take only that dose. Do not take double or extra doses. What may interact with this medicine? Do not take this medicine with any of the following medications: -certain medicines for fungal infections like fluconazole, itraconazole, ketoconazole, posaconazole, voriconazole -cisapride -dofetilide -dronedarone -linezolid -MAOIs like Carbex, Eldepryl, Marplan, Nardil, and Parnate -mesoridazine -methylene blue (injected into  a vein) -pimozide -saquinavir -thioridazine -ziprasidone This medicine may also interact with the following medications: -alcohol -antiviral medicines for HIV or AIDS -aspirin and aspirin-like medicines -barbiturates like phenobarbital -certain medicines for blood pressure, heart disease, irregular heart beat -certain medicines for depression, anxiety, or psychotic disturbances -certain medicines for migraine headache like almotriptan, eletriptan, frovatriptan, naratriptan, rizatriptan, sumatriptan, zolmitriptan -certain medicines for seizures like carbamazepine and phenytoin -certain medicines for sleep -certain medicines that treat or prevent blood clots like dalteparin, enoxaparin, warfarin -digoxin -fentanyl -lithium -NSAIDS, medicines for pain and inflammation, like ibuprofen or naproxen -other medicines that prolong the QT interval (cause an abnormal heart  rhythm) -rasagiline -supplements like St. John's wort, kava kava, valerian -tramadol -tryptophan This list may not describe all possible interactions. Give your health care provider a list of all the medicines, herbs, non-prescription drugs, or dietary supplements you use. Also tell them if you smoke, drink alcohol, or use illegal drugs. Some items may interact with your medicine. What should I watch for while using this medicine? Tell your doctor if your symptoms do not get better or if they get worse. Visit your doctor or health care professional for regular checks on your progress. Because it may take several weeks to see the full effects of this medicine, it is important to continue your treatment as prescribed by your doctor. Patients and their families should watch out for new or worsening thoughts of suicide or depression. Also watch out for sudden changes in feelings such as feeling anxious, agitated, panicky, irritable, hostile, aggressive, impulsive, severely restless, overly excited and hyperactive, or not being able to sleep. If this happens, especially at the beginning of treatment or after a change in dose, call your health care professional. Bonita Quin may get drowsy or dizzy. Do not drive, use machinery, or do anything that needs mental alertness until you know how this medicine affects you. Do not stand or sit up quickly, especially if you are an older patient. This reduces the risk of dizzy or fainting spells. Alcohol may interfere with the effect of this medicine. Avoid alcoholic drinks. This medicine may cause dry eyes and blurred vision. If you wear contact lenses you may feel some discomfort. Lubricating drops may help. See your eye doctor if the problem does not go away or is severe. Your mouth may get dry. Chewing sugarless gum, sucking hard candy and drinking plenty of water may help. Contact your doctor if the problem does not go away or is severe. What side effects may I notice from  receiving this medicine? Side effects that you should report to your doctor or health care professional as soon as possible: -allergic reactions like skin rash, itching or hives, swelling of the face, lips, or tongue -elevated mood, decreased need for sleep, racing thoughts, impulsive behavior -confusion -fast, irregular heartbeat -feeling faint or lightheaded, falls -feeling agitated, angry, or irritable -loss of balance or coordination -painful or prolonged erections -restlessness, pacing, inability to keep still -suicidal thoughts or other mood changes -tremors -trouble sleeping -seizures -unusual bleeding or bruising Side effects that usually do not require medical attention (report to your doctor or health care professional if they continue or are bothersome): -change in sex drive or performance -change in appetite or weight -constipation -headache -muscle aches or pains -nausea This list may not describe all possible side effects. Call your doctor for medical advice about side effects. You may report side effects to FDA at 1-800-FDA-1088. Where should I keep my medicine? Keep out of the reach  of children. Store at room temperature between 15 and 30 degrees C (59 to 86 degrees F). Protect from light. Keep container tightly closed. Throw away any unused medicine after the expiration date. NOTE: This sheet is a summary. It may not cover all possible information. If you have questions about this medicine, talk to your doctor, pharmacist, or health care provider.  2018 Elsevier/Gold Standard (2015-05-22 16:57:05)  Sleep Apnea Sleep apnea is a condition in which breathing pauses or becomes shallow during sleep. Episodes of sleep apnea usually last 10 seconds or longer, and they may occur as many as 20 times an hour. Sleep apnea disrupts your sleep and keeps your body from getting the rest that it needs. This condition can increase your risk of certain health problems,  including:  Heart attack.  Stroke.  Obesity.  Diabetes.  Heart failure.  Irregular heartbeat.  There are three kinds of sleep apnea:  Obstructive sleep apnea. This kind is caused by a blocked or collapsed airway.  Central sleep apnea. This kind happens when the part of the brain that controls breathing does not send the correct signals to the muscles that control breathing.  Mixed sleep apnea. This is a combination of obstructive and central sleep apnea.  What are the causes? The most common cause of this condition is a collapsed or blocked airway. An airway can collapse or become blocked if:  Your throat muscles are abnormally relaxed.  Your tongue and tonsils are larger than normal.  You are overweight.  Your airway is smaller than normal.  What increases the risk? This condition is more likely to develop in people who:  Are overweight.  Smoke.  Have a smaller than normal airway.  Are elderly.  Are female.  Drink alcohol.  Take sedatives or tranquilizers.  Have a family history of sleep apnea.  What are the signs or symptoms? Symptoms of this condition include:  Trouble staying asleep.  Daytime sleepiness and tiredness.  Irritability.  Loud snoring.  Morning headaches.  Trouble concentrating.  Forgetfulness.  Decreased interest in sex.  Unexplained sleepiness.  Mood swings.  Personality changes.  Feelings of depression.  Waking up often during the night to urinate.  Dry mouth.  Sore throat.  How is this diagnosed? This condition may be diagnosed with:  A medical history.  A physical exam.  A series of tests that are done while you are sleeping (sleep study). These tests are usually done in a sleep lab, but they may also be done at home.  How is this treated? Treatment for this condition aims to restore normal breathing and to ease symptoms during sleep. It may involve managing health issues that can affect breathing, such  as high blood pressure or obesity. Treatment may include:  Sleeping on your side.  Using a decongestant if you have nasal congestion.  Avoiding the use of depressants, including alcohol, sedatives, and narcotics.  Losing weight if you are overweight.  Making changes to your diet.  Quitting smoking.  Using a device to open your airway while you sleep, such as: ? An oral appliance. This is a custom-made mouthpiece that shifts your lower jaw forward. ? A continuous positive airway pressure (CPAP) device. This device delivers oxygen to your airway through a mask. ? A nasal expiratory positive airway pressure (EPAP) device. This device has valves that you put into each nostril. ? A bi-level positive airway pressure (BPAP) device. This device delivers oxygen to your airway through a mask.  Surgery if  other treatments do not work. During surgery, excess tissue is removed to create a wider airway.  It is important to get treatment for sleep apnea. Without treatment, this condition can lead to:  High blood pressure.  Coronary artery disease.  (Men) An inability to achieve or maintain an erection (impotence).  Reduced thinking abilities.  Follow these instructions at home:  Make any lifestyle changes that your health care provider recommends.  Eat a healthy, well-balanced diet.  Take over-the-counter and prescription medicines only as told by your health care provider.  Avoid using depressants, including alcohol, sedatives, and narcotics.  Take steps to lose weight if you are overweight.  If you were given a device to open your airway while you sleep, use it only as told by your health care provider.  Do not use any tobacco products, such as cigarettes, chewing tobacco, and e-cigarettes. If you need help quitting, ask your health care provider.  Keep all follow-up visits as told by your health care provider. This is important. Contact a health care provider if:  The device  that you received to open your airway during sleep is uncomfortable or does not seem to be working.  Your symptoms do not improve.  Your symptoms get worse. Get help right away if:  You develop chest pain.  You develop shortness of breath.  You develop discomfort in your back, arms, or stomach.  You have trouble speaking.  You have weakness on one side of your body.  You have drooping in your face. These symptoms may represent a serious problem that is an emergency. Do not wait to see if the symptoms will go away. Get medical help right away. Call your local emergency services (911 in the [place] Do not drive yourself to the hospital. This information is not intended to replace advice given to you by your health care provider. Make sure you discuss any questions you have with your health care provider. Document Released: 12/11/2001 Document Revised: 08/17/2015 Document Reviewed: 09/30/2014 Elsevier Interactive Patient Education  [employer].

## 2017-03-18 LAB — URINALYSIS, ROUTINE W REFLEX MICROSCOPIC
BILIRUBIN URINE: NEGATIVE
Hgb urine dipstick: NEGATIVE
Ketones, ur: NEGATIVE
NITRITE: NEGATIVE
PH: 7 (ref 5.0–8.0)
RBC / HPF: NONE SEEN (ref 0–?)
Specific Gravity, Urine: 1.01 (ref 1.000–1.030)
Total Protein, Urine: NEGATIVE
Urine Glucose: NEGATIVE
Urobilinogen, UA: 0.2 (ref 0.0–1.0)

## 2017-03-21 ENCOUNTER — Encounter: Payer: Self-pay | Admitting: Family Medicine

## 2017-03-21 ENCOUNTER — Other Ambulatory Visit: Payer: Self-pay

## 2017-03-21 MED ORDER — NITROFURANTOIN MONOHYD MACRO 100 MG PO CAPS: 100.0000 mg | ORAL_CAPSULE | Freq: Two times a day (BID) | ORAL | 0 refills | Status: AC

## 2017-03-25 ENCOUNTER — Ambulatory Visit (INDEPENDENT_AMBULATORY_CARE_PROVIDER_SITE_OTHER): Payer: BC Managed Care – PPO

## 2017-03-25 DIAGNOSIS — R079 Chest pain, unspecified: Secondary | ICD-10-CM | POA: Diagnosis not present

## 2017-03-25 LAB — EXERCISE TOLERANCE TEST
CHL CUP MPHR: 176 {beats}/min
CSEPED: 9 min
Estimated workload: 10.4 METS
Exercise duration (sec): 0 s
Peak HR: 166 {beats}/min
Percent HR: 94 %
RPE: 15
Rest HR: 73 {beats}/min

## 2017-04-06 ENCOUNTER — Encounter: Payer: Self-pay | Admitting: Family Medicine

## 2017-04-25 ENCOUNTER — Ambulatory Visit: Payer: BC Managed Care – PPO | Admitting: Family Medicine

## 2017-04-25 ENCOUNTER — Encounter: Payer: Self-pay | Admitting: Family Medicine

## 2017-04-25 VITALS — BP 118/70 | HR 83 | Ht 64.0 in | Wt 156.4 lb

## 2017-04-25 DIAGNOSIS — F320 Major depressive disorder, single episode, mild: Secondary | ICD-10-CM

## 2017-04-25 DIAGNOSIS — F5101 Primary insomnia: Secondary | ICD-10-CM | POA: Diagnosis not present

## 2017-04-25 MED ORDER — ESZOPICLONE 2 MG PO TABS: 2.0000 mg | ORAL_TABLET | Freq: Every evening | ORAL | 0 refills | Status: DC | PRN

## 2017-04-25 MED ORDER — FLUOXETINE HCL 10 MG PO CAPS: ORAL_CAPSULE | ORAL | 1 refills | Status: DC

## 2017-04-25 NOTE — Progress Notes (Signed)
Subjective:  Patient ID: Julie Hardy, female    DOB: 09/16/1970  Age: 47 y.o. MRN: [id number]  CC: Follow-up   HPI Julie Hardy presents for follow-up of Julie Hardy stress, dysthymia and insomnia.  She is also scheduled for a consult for sleep study at the end of May.  She is seeing a counselor who has suggested some type of medical therapy as well.  Patient says that the trazodone was not helpful and gave Julie Hardy a try mouth in the morning.  She is already dealing with constipation and stools only once a week.  She prefers not to have him medicine associated with any weight gain.  Outpatient Medications Prior to Visit  Medication Sig Dispense Refill  . Artificial Tear Ointment (DRY EYES OP) Apply 1 drop to eye as needed.    Marland Kitchen ibuprofen (ADVIL) 200 MG tablet Take 400 mg by mouth at bedtime.    Marland Kitchen loratadine (CLARITIN) 10 MG tablet Take 10 mg by mouth at bedtime.    . traZODone (DESYREL) 50 MG tablet Take 0.5-1 tablets (25-50 mg total) by mouth at bedtime as needed for sleep. 30 tablet 3  . Vitamin D, Ergocalciferol, (DRISDOL) 50000 units CAPS capsule Take 1 capsule (50,000 Units total) by mouth every 7 (seven) days. 30 capsule 0   No facility-administered medications prior to visit.     ROS Review of Systems  Constitutional: Negative.   HENT: Negative.   Eyes: Negative.   Respiratory: Negative.   Cardiovascular: Negative.   Gastrointestinal: Negative.   Endocrine: Negative.   Genitourinary: Negative.   Musculoskeletal: Negative.   Skin: Negative.   Allergic/Immunologic: Negative for immunocompromised state.  Neurological: Negative.   Hematological: Negative.   Psychiatric/Behavioral: Positive for dysphoric mood and sleep disturbance. Negative for self-injury and suicidal ideas.    Objective:  BP 118/70 (BP Location: Right Arm, Patient Position: Sitting, Cuff Size: Normal)   Pulse 83   Ht 5\' 4"  (1.626 m)   Wt 156 lb 6 oz (70.9 kg)   SpO2 97%   BMI 26.84 kg/m   BP  Readings from Last 3 Encounters:  04/25/17 118/70  03/17/17 110/80  03/15/17 128/80    Wt Readings from Last 3 Encounters:  04/25/17 156 lb 6 oz (70.9 kg)  03/17/17 154 lb 12.8 oz (70.2 kg)  03/15/17 153 lb (69.4 kg)    Physical Exam  Constitutional: She appears well-developed and well-nourished. No distress.  HENT:  Head: Normocephalic and atraumatic.  Right Ear: External ear normal.  Left Ear: External ear normal.  Mouth/Throat: No oropharyngeal exudate.  Eyes: Right eye exhibits no discharge. Left eye exhibits no discharge. No scleral icterus.  Neck: Normal range of motion. No JVD present. No tracheal deviation present.  Pulmonary/Chest: Effort normal.  Skin: Skin is warm and dry. She is not diaphoretic.  Psychiatric: She has a normal mood and affect. Julie Hardy behavior is normal. Thought content normal.    Lab Results  Component Value Date   WBC 8.8 03/11/2017   HGB 12.8 03/11/2017   HCT 37.2 03/11/2017   PLT 246 03/11/2017   GLUCOSE 83 03/11/2017   ALT 14 05/22/2012   AST 14 05/22/2012   NA 141 03/11/2017   K 4.0 03/11/2017   CL 107 03/11/2017   CREATININE 0.87 03/11/2017   BUN 16 03/11/2017   CO2 27 03/11/2017   INR 0.9 10/03/2008    Dg Chest 2 View  Result Date: 03/11/2017 CLINICAL DATA:  47 y/o F; worsening chest pain, shortness  of breath, lightheadedness. EXAM: CHEST - 2 VIEW COMPARISON:  None. FINDINGS: The heart size and mediastinal contours are within normal limits. Both lungs are clear. The visualized skeletal structures are unremarkable. IMPRESSION: No acute pulmonary process identified. Electronically Signed   By: Mitzi HansenLance  Furusawa-Stratton M.D.   On: 03/11/2017 16:09    Assessment & Plan:   Julie NimrodMeredith was seen today for follow-up.  Diagnoses and all orders for this visit:  Depression, major, single episode, mild (HCC) -     FLUoxetine (PROZAC) 10 MG capsule; One capsule daily for one week and then increase to two daily.  Primary insomnia -      eszopiclone (LUNESTA) 2 MG TABS tablet; Take 1 tablet (2 mg total) by mouth at bedtime as needed for sleep. Take immediately before bedtime   I am having Julie LopeMeredith E. Hardy start on FLUoxetine and eszopiclone. I am also having Julie Hardy maintain Julie Hardy ibuprofen, loratadine, Artificial Tear Ointment (DRY EYES OP), Vitamin D (Ergocalciferol), and traZODone.  Meds ordered this encounter  Medications  . FLUoxetine (PROZAC) 10 MG capsule    Sig: One capsule daily for one week and then increase to two daily.    Dispense:  60 capsule    Refill:  1  . eszopiclone (LUNESTA) 2 MG TABS tablet    Sig: Take 1 tablet (2 mg total) by mouth at bedtime as needed for sleep. Take immediately before bedtime    Dispense:  30 tablet    Refill:  0   I spent 20 minutes discussing options to treat depression.  We both decided that Prozac would be Julie Hardy best choice.  She will use Lunesta as needed for sleep.  Stressed again that it will be important for Julie Hardy to have the sleep study and be treated for apnea that is an issue for Julie Hardy.  Follow-up: Return return in 4-6 weeks.  Mliss SaxWilliam Alfred Demetria Iwai, MD

## 2017-04-25 NOTE — Patient Instructions (Signed)
Living With Depression Everyone experiences occasional disappointment, sadness, and loss in their lives. When you are feeling down, blue, or sad for at least 2 weeks in a row, it may mean that you have depression. Depression can affect your thoughts and feelings, relationships, daily activities, and physical health. It is caused by changes in the way your brain functions. If you receive a diagnosis of depression, your health care provider will tell you which type of depression you have and what treatment options are available to you. If you are living with depression, there are ways to help you recover from it and also ways to prevent it from coming back. How to cope with lifestyle changes Coping with stress Stress is your body's reaction to life changes and events, both good and bad. Stressful situations may include:  Getting married.  The death of a spouse.  Losing a job.  Retiring.  Having a baby.  Stress can last just a few hours or it can be ongoing. Stress can play a major role in depression, so it is important to learn both how to cope with stress and how to think about it differently. Talk with your health care provider or a counselor if you would like to learn more about stress reduction. He or she may suggest some stress reduction techniques, such as:  Music therapy. This can include creating music or listening to music. Choose music that you enjoy and that inspires you.  Mindfulness-based meditation. This kind of meditation can be done while sitting or walking. It involves being aware of your normal breaths, rather than trying to control your breathing.  Centering prayer. This is a kind of meditation that involves focusing on a spiritual word or phrase. Choose a word, phrase, or sacred image that is meaningful to you and that brings you peace.  Deep breathing. To do this, expand your stomach and inhale slowly through your nose. Hold your breath for 3-5 seconds, then exhale  slowly, allowing your stomach muscles to relax.  Muscle relaxation. This involves intentionally tensing muscles then relaxing them.  Choose a stress reduction technique that fits your lifestyle and personality. Stress reduction techniques take time and practice to develop. Set aside 5-15 minutes a day to do them. Therapists can offer training in these techniques. The training may be covered by some insurance plans. Other things you can do to manage stress include:  Keeping a stress diary. This can help you learn what triggers your stress and ways to control your response.  Understanding what your limits are and saying no to requests or events that lead to a schedule that is too full.  Thinking about how you respond to certain situations. You may not be able to control everything, but you can control how you react.  Adding humor to your life by watching funny films or TV shows.  Making time for activities that help you relax and not feeling guilty about spending your time this way.  Medicines Your health care provider may suggest certain medicines if he or she feels that they will help improve your condition. Avoid using alcohol and other substances that may prevent your medicines from working properly (may interact). It is also important to:  Talk with your pharmacist or health care provider about all the medicines that you take, their possible side effects, and what medicines are safe to take together.  Make it your goal to take part in all treatment decisions (shared decision-making). This includes giving input on the side   effects of medicines. It is best if shared decision-making with your health care provider is part of your total treatment plan.  If your health care provider prescribes a medicine, you may not notice the full benefits of it for 4-8 weeks. Most people who are treated for depression need to be on medicine for at least 6-12 months after they feel better. If you are taking  medicines as part of your treatment, do not stop taking medicines without first talking to your health care provider. You may need to have the medicine slowly decreased (tapered) over time to decrease the risk of harmful side effects. Relationships Your health care provider may suggest family therapy along with individual therapy and drug therapy. While there may not be family problems that are causing you to feel depressed, it is still important to make sure your family learns as much as they can about your mental health. Having your family's support can help make your treatment successful. How to recognize changes in your condition Everyone has a different response to treatment for depression. Recovery from major depression happens when you have not had signs of major depression for two months. This may mean that you will start to:  Have more interest in doing activities.  Feel less hopeless than you did 2 months ago.  Have more energy.  Overeat less often, or have better or improving appetite.  Have better concentration.  Your health care provider will work with you to decide the next steps in your recovery. It is also important to recognize when your condition is getting worse. Watch for these signs:  Having fatigue or low energy.  Eating too much or too little.  Sleeping too much or too little.  Feeling restless, agitated, or hopeless.  Having trouble concentrating or making decisions.  Having unexplained physical complaints.  Feeling irritable, angry, or aggressive.  Get help as soon as you or your family members notice these symptoms coming back. How to get support and help from others How to talk with friends and family members about your condition Talking to friends and family members about your condition can provide you with one way to get support and guidance. Reach out to trusted friends or family members, explain your symptoms to them, and let them know that you are  working with a health care provider to treat your depression. Financial resources Not all insurance plans cover mental health care, so it is important to check with your insurance carrier. If paying for co-pays or counseling services is a problem, search for a local or county mental health care center. They may be able to offer public mental health care services at low or no cost when you are not able to see a private health care provider. If you are taking medicine for depression, you may be able to get the generic form, which may be less expensive. Some makers of prescription medicines also offer help to patients who cannot afford the medicines they need. Follow these instructions at home:  Get the right amount and quality of sleep.  Cut down on using caffeine, tobacco, alcohol, and other potentially harmful substances.  Try to exercise, such as walking or lifting small weights.  Take over-the-counter and prescription medicines only as told by your health care provider.  Eat a healthy diet that includes plenty of vegetables, fruits, whole grains, low-fat dairy products, and lean protein. Do not eat a lot of foods that are high in solid fats, added sugars, or salt.    Try to exercise, such as walking or lifting small weights.   Take over-the-counter and prescription medicines only as told by your health care provider.   Eat a healthy diet that includes plenty of vegetables, fruits, whole grains, low-fat dairy products, and lean protein. Do not eat a lot of foods that are high in solid fats, added sugars, or salt.   Keep all follow-up visits as told by your health care provider. This is important.  Contact a health care provider if:   You stop taking your antidepressant medicines, and you have any of these symptoms:  ? Nausea.  ? Headache.  ? Feeling lightheaded.  ? Chills and body aches.  ? Not being able to sleep (insomnia).   You or your friends and family think your depression is getting worse.  Get help right away if:   You have thoughts of hurting yourself or others.  If you ever feel like you may hurt yourself or others, or have thoughts about taking your own life, get help right away. You can go to your nearest emergency department or call:   Your local emergency services (911 in the [place]   A suicide crisis helpline, such as the  National Suicide Prevention Lifeline at 1-800-273-8255. This is open 24-hours a day.    Summary   If you are living with depression, there are ways to help you recover from it and also ways to prevent it from coming back.   Work with your health care team to create a management plan that includes counseling, stress management techniques, and healthy lifestyle habits.  This information is not intended to replace advice given to you by your health care provider. Make sure you discuss any questions you have with your health care provider.  Document Released: 11/24/2015 Document Revised: 11/24/2015 Document Reviewed: 11/24/2015  Elsevier Interactive Patient Education  2018 [employer]    Insomnia  Insomnia is a sleep disorder that makes it difficult to fall asleep or to stay asleep. Insomnia can cause tiredness (fatigue), low energy, difficulty concentrating, mood swings, and poor performance at work or school.  There are three different ways to classify insomnia:   Difficulty falling asleep.   Difficulty staying asleep.   Waking up too early in the morning.    Any type of insomnia can be long-term (chronic) or short-term (acute). Both are common. Short-term insomnia usually lasts for three months or less. Chronic insomnia occurs at least three times a week for longer than three months.  What are the causes?  Insomnia may be caused by another condition, situation, or substance, such as:   Anxiety.   Certain medicines.   Gastroesophageal reflux disease (GERD) or other gastrointestinal conditions.   Asthma or other breathing conditions.   Restless legs syndrome, sleep apnea, or other sleep disorders.   Chronic pain.   Menopause. This may include hot flashes.   Stroke.   Abuse of alcohol, tobacco, or illegal drugs.   Depression.   Caffeine.   Neurological disorders, such as Alzheimer disease.   An overactive thyroid (hyperthyroidism).    The cause of insomnia may not be known.  What increases the  risk?  Risk factors for insomnia include:   Gender. Women are more commonly affected than men.   Age. Insomnia is more common as you get older.   Stress. This may involve your professional or personal life.   Income. Insomnia is more common in people with lower income.   Lack of exercise.     home:  Take medicines only as directed by your health care provider.  Keep regular sleeping and waking hours. Avoid naps.  Keep a sleep diary to help you and your health care provider figure out what could be causing your insomnia. Include: ? When you sleep. ? When you wake up during the night. ? How well you sleep. ? How rested you feel the next day. ? Any side effects of medicines you are taking. ? What you eat and drink.  Make your bedroom a comfortable place where it is easy to fall asleep: ? Put up shades or special blackout curtains to block light from outside. ? Use a white noise machine to block noise. ? Keep the temperature cool.  Exercise regularly as directed by your health care provider. Avoid exercising right before bedtime.  Use relaxation techniques to manage stress. Ask your health care provider to suggest some techniques that may work well for you.  These may include: ? Breathing exercises. ? Routines to release muscle tension. ? Visualizing peaceful scenes.  Cut back on alcohol, caffeinated beverages, and cigarettes, especially close to bedtime. These can disrupt your sleep.  Do not overeat or eat spicy foods right before bedtime. This can lead to digestive discomfort that can make it hard for you to sleep.  Limit screen use before bedtime. This includes: ? Watching TV. ? Using your smartphone, tablet, and computer.  Stick to a routine. This can help you fall asleep faster. Try to do a quiet activity, brush your teeth, and go to bed at the same time each night.  Get out of bed if you are still awake after 15 minutes of trying to sleep. Keep the lights down, but try reading or doing a quiet activity. When you feel sleepy, go back to bed.  Make sure that you drive carefully. Avoid driving if you feel very sleepy.  Keep all follow-up appointments as directed by your health care provider. This is important. Contact a health care provider if:  You are tired throughout the day or have trouble in your daily routine due to sleepiness.  You continue to have sleep problems or your sleep problems get worse. Get help right away if:  You have serious thoughts about hurting yourself or someone else. This information is not intended to replace advice given to you by your health care provider. Make sure you discuss any questions you have with your health care provider. Document Released: 12/19/1999 Document Revised: 05/23/2015 Document Reviewed: 09/21/2013 Elsevier Interactive Patient Education  2018 [employer]. Fluoxetine capsules or tablets (Depression/Mood Disorders) What is this medicine? FLUOXETINE (floo OX e teen) belongs to a class of drugs known as selective serotonin reuptake inhibitors (SSRIs). It helps to treat mood problems such as depression, obsessive compulsive disorder, and panic attacks. It can also treat certain eating  disorders. This medicine may be used for other purposes; ask your health care provider or pharmacist if you have questions. COMMON BRAND NAME(S): Prozac What should I tell my health care provider before I take this medicine? They need to know if you have any of these conditions: -bipolar disorder or a family history of bipolar disorder -bleeding disorders -glaucoma -heart disease -liver disease -low levels of sodium in the blood -seizures -suicidal thoughts, plans, or attempt; a previous suicide attempt by you or a family member -take MAOIs like Carbex, Eldepryl, Marplan, Nardil, and Parnate -take medicines that treat or prevent blood clots -thyroid disease -an unusual or allergic reaction to fluoxetine, other medicines,  foods, dyes, or preservatives -pregnant or trying to get pregnant -breast-feeding How should I use this medicine? Take this medicine by mouth with a glass of water. Follow the directions on the prescription label. You can take this medicine with or without food. Take your medicine at regular intervals. Do not take it more often than directed. Do not stop taking this medicine suddenly except upon the advice of your doctor. Stopping this medicine too quickly may cause serious side effects or your condition may worsen. A special MedGuide will be given to you by the pharmacist with each prescription and refill. Be sure to read this information carefully each time. Talk to your pediatrician regarding the use of this medicine in children. While this drug may be prescribed for children as young as 7 years for selected conditions, precautions do apply. Overdosage: If you think you have taken too much of this medicine contact a poison control center or emergency room at once. NOTE: This medicine is only for you. Do not share this medicine with others. What if I miss a dose? If you miss a dose, skip the missed dose and go back to your regular dosing schedule. Do not take double or  extra doses. What may interact with this medicine? Do not take this medicine with any of the following medications: -other medicines containing fluoxetine, like Sarafem or Symbyax -cisapride -linezolid -MAOIs like Carbex, Eldepryl, Marplan, Nardil, and Parnate -methylene blue (injected into a vein) -pimozide -thioridazine This medicine may also interact with the following medications: -alcohol -amphetamines -aspirin and aspirin-like medicines -carbamazepine -certain medicines for depression, anxiety, or psychotic disturbances -certain medicines for migraine headaches like almotriptan, eletriptan, frovatriptan, naratriptan, rizatriptan, sumatriptan, zolmitriptan -digoxin -diuretics -fentanyl -flecainide -furazolidone -isoniazid -lithium -medicines for sleep -medicines that treat or prevent blood clots like warfarin, enoxaparin, and dalteparin -NSAIDs, medicines for pain and inflammation, like ibuprofen or naproxen -phenytoin -procarbazine -propafenone -rasagiline -ritonavir -supplements like St. John's wort, kava kava, valerian -tramadol -tryptophan -vinblastine This list may not describe all possible interactions. Give your health care provider a list of all the medicines, herbs, non-prescription drugs, or dietary supplements you use. Also tell them if you smoke, drink alcohol, or use illegal drugs. Some items may interact with your medicine. What should I watch for while using this medicine? Tell your doctor if your symptoms do not get better or if they get worse. Visit your doctor or health care professional for regular checks on your progress. Because it may take several weeks to see the full effects of this medicine, it is important to continue your treatment as prescribed by your doctor. Patients and their families should watch out for new or worsening thoughts of suicide or depression. Also watch out for sudden changes in feelings such as feeling anxious, agitated,  panicky, irritable, hostile, aggressive, impulsive, severely restless, overly excited and hyperactive, or not being able to sleep. If this happens, especially at the beginning of treatment or after a change in dose, call your health care professional. Bonita Quin may get drowsy or dizzy. Do not drive, use machinery, or do anything that needs mental alertness until you know how this medicine affects you. Do not stand or sit up quickly, especially if you are an older patient. This reduces the risk of dizzy or fainting spells. Alcohol may interfere with the effect of this medicine. Avoid alcoholic drinks. Your mouth may get dry. Chewing sugarless gum or sucking hard candy, and drinking plenty of water may help. Contact your doctor if the problem does  not go away or is severe. This medicine may affect blood sugar levels. If you have diabetes, check with your doctor or health care professional before you change your diet or the dose of your diabetic medicine. What side effects may I notice from receiving this medicine? Side effects that you should report to your doctor or health care professional as soon as possible: -allergic reactions like skin rash, itching or hives, swelling of the face, lips, or tongue -anxious -black, tarry stools -breathing problems -changes in vision -confusion -elevated mood, decreased need for sleep, racing thoughts, impulsive behavior -eye pain -fast, irregular heartbeat -feeling faint or lightheaded, falls -feeling agitated, angry, or irritable -hallucination, loss of contact with reality -loss of balance or coordination -loss of memory -painful or prolonged erections -restlessness, pacing, inability to keep still -seizures -stiff muscles -suicidal thoughts or other mood changes -trouble sleeping -unusual bleeding or bruising -unusually weak or tired -vomiting Side effects that usually do not require medical attention (report to your doctor or health care professional if  they continue or are bothersome): -change in appetite or weight -change in sex drive or performance -diarrhea -dry mouth -headache -increased sweating -nausea -tremors This list may not describe all possible side effects. Call your doctor for medical advice about side effects. You may report side effects to FDA at 1-800-FDA-1088. Where should I keep my medicine? Keep out of the reach of children. Store at room temperature between 15 and 30 degrees C (59 and 86 degrees F). Throw away any unused medicine after the expiration date. NOTE: This sheet is a summary. It may not cover all possible information. If you have questions about this medicine, talk to your doctor, pharmacist, or health care provider.  2018 Elsevier/Gold Standard (2015-05-24 15:55:27) Eszopiclone tablets What is this medicine? ESZOPICLONE (es ZOE pi clone) is used to treat insomnia. This medicine helps you to fall asleep and sleep through the night. This medicine may be used for other purposes; ask your health care provider or pharmacist if you have questions. COMMON BRAND NAME(S): Lunesta What should I tell my health care provider before I take this medicine? They need to know if you have any of these conditions: -depression -history of a drug or alcohol abuse problem -liver disease -lung or breathing disease -suicidal thoughts -an unusual or allergic reaction to eszopiclone, other medicines, foods, dyes, or preservatives -pregnant or trying to get pregnant -breast-feeding How should I use this medicine? Take this medicine by mouth with a glass of water. Follow the directions on the prescription label. It is better to take this medicine on an empty stomach and only when you are ready for bed. Do not take your medicine more often than directed. If you have been taking this medicine for several weeks and suddenly stop taking it, you may get unpleasant withdrawal symptoms. Your doctor or health care professional may want  to gradually reduce the dose. Do not stop taking this medicine on your own. Always follow your doctor or health care professional's advice. Talk to your pediatrician regarding the use of this medicine in children. Special care may be needed. Overdosage: If you think you have taken too much of this medicine contact a poison control center or emergency room at once. NOTE: This medicine is only for you. Do not share this medicine with others. What if I miss a dose? This does not apply. This medicine should only be taken immediately before going to sleep. Do not take double or extra doses. What may interact  with this medicine? -herbal medicines like kava kava, melatonin, St. John's wort and valerian -lorazepam -medicines for fungal infections like ketoconazole, fluconazole, or itraconazole -olanzapine This list may not describe all possible interactions. Give your health care provider a list of all the medicines, herbs, non-prescription drugs, or dietary supplements you use. Also tell them if you smoke, drink alcohol, or use illegal drugs. Some items may interact with your medicine. What should I watch for while using this medicine? Visit your doctor or health care professional for regular checks on your progress. Keep a regular sleep schedule by going to bed at about the same time nightly. Avoid caffeine-containing drinks in the evening hours, as caffeine can cause trouble with falling asleep. Talk to your doctor if you still have trouble sleeping. After taking this medicine for sleep, you may get up out of bed while not being fully awake and do an activity that you do not know you are doing. The next morning, you may have no memory of the event. Activities such as driving a car ("sleep-driving"), making and eating food, talking on the phone, sexual activity, and sleep-walking have been reported. Call your doctor right away if you find out you have done any of these activities. Do not take this medicine  if you have used alcohol that evening or before bed or taken another medicine for sleep, since your risk of doing these sleep-related activities will be increased. Do not take this medicine unless you are able to stay in bed for a full night (7 to 8 hours) before you must be active again. You may have a decrease in mental alertness the day after use, even if you feel that you are fully awake. Tell your doctor if you will need to perform activities requiring full alertness, such as driving, the next day. Do not stand or sit up quickly after taking this medicine, especially if you are an older patient. This reduces the risk of dizzy or fainting spells. If you or your family notice any changes in your behavior, such as new or worsening depression, thoughts of harming yourself, anxiety, other unusual or disturbing thoughts, or memory loss, call your doctor right away. After you stop taking this medicine, you may have trouble falling asleep. This is called rebound insomnia. This problem usually goes away on its own after 1 or 2 nights. What side effects may I notice from receiving this medicine? Side effects that you should report to your doctor or health care professional as soon as possible: -allergic reactions like skin rash, itching or hives, swelling of the face, lips, or tongue -changes in vision -confusion -depressed mood -feeling faint or lightheaded, falls -hallucinations -problems with balance, speaking, walking -restlessness, excitability, or feelings of agitation -unusual activities while asleep like driving, eating, making phone calls Side effects that usually do not require medical attention (report to your doctor or health care professional if they continue or are bothersome): -dizziness, or daytime drowsiness, sometimes called a hangover effect -headache This list may not describe all possible side effects. Call your doctor for medical advice about side effects. You may report side  effects to FDA at 1-800-FDA-1088. Where should I keep my medicine? Keep out of the reach of children. This medicine can be abused. Keep your medicine in a safe place to protect it from theft. Do not share this medicine with anyone. Selling or giving away this medicine is dangerous and against the law. This medicine may cause accidental overdose and death if taken by  other adults, children, or pets. Mix any unused medicine with a substance like cat litter or coffee grounds. Then throw the medicine away in a sealed container like a sealed bag or a coffee can with a lid. Do not use the medicine after the expiration date. Store at room temperature between 15 and 30 degrees C (59 and 86 degrees F). NOTE: This sheet is a summary. It may not cover all possible information. If you have questions about this medicine, talk to your doctor, pharmacist, or health care provider.  2018 Elsevier/Gold Standard (2013-09-11 15:22:01)

## 2017-05-10 ENCOUNTER — Institutional Professional Consult (permissible substitution): Payer: BC Managed Care – PPO | Admitting: Neurology

## 2017-05-19 ENCOUNTER — Ambulatory Visit: Payer: BC Managed Care – PPO | Admitting: Neurology

## 2017-05-19 ENCOUNTER — Encounter: Payer: Self-pay | Admitting: Neurology

## 2017-05-19 ENCOUNTER — Ambulatory Visit: Payer: BC Managed Care – PPO | Admitting: Family Medicine

## 2017-05-19 ENCOUNTER — Encounter: Payer: Self-pay | Admitting: Family Medicine

## 2017-05-19 VITALS — BP 134/80 | Temp 97.5°F | Ht 64.0 in | Wt 155.0 lb

## 2017-05-19 VITALS — BP 151/94 | HR 72 | Ht 64.0 in | Wt 155.0 lb

## 2017-05-19 DIAGNOSIS — E663 Overweight: Secondary | ICD-10-CM

## 2017-05-19 DIAGNOSIS — R51 Headache: Secondary | ICD-10-CM

## 2017-05-19 DIAGNOSIS — Z820 Family history of epilepsy and other diseases of the nervous system: Secondary | ICD-10-CM

## 2017-05-19 DIAGNOSIS — G4719 Other hypersomnia: Secondary | ICD-10-CM | POA: Diagnosis not present

## 2017-05-19 DIAGNOSIS — K589 Irritable bowel syndrome without diarrhea: Secondary | ICD-10-CM

## 2017-05-19 DIAGNOSIS — R519 Headache, unspecified: Secondary | ICD-10-CM

## 2017-05-19 DIAGNOSIS — F439 Reaction to severe stress, unspecified: Secondary | ICD-10-CM

## 2017-05-19 DIAGNOSIS — R0683 Snoring: Secondary | ICD-10-CM

## 2017-05-19 NOTE — Patient Instructions (Addendum)
Thank you for choosing [facility] for your sleep related care! It was nice to meet you today! I appreciate that you entrust me with your sleep related healthcare concerns. I hope, I was able to address at least some of your concerns today, and that I can help you feel reassured and also get better.    Here is what we discussed today and what we came up with as our plan for you:    Based on your symptoms and your exam I believe you are at risk for obstructive sleep apnea (aka OSA), and I think we should proceed with a sleep study to determine whether you do or do not have OSA and how severe it is. Even, if you have mild OSA, I may want you to consider treatment with CPAP, as treatment of even borderline or mild sleep apnea can result and improvement of symptoms such as sleep disruption, daytime sleepiness, nighttime bathroom breaks, restless leg symptoms, improvement of headache syndromes, even improved mood disorder.   Please remember, the long-term risks and ramifications of untreated moderate to severe obstructive sleep apnea are: increased Cardiovascular disease, including congestive heart failure, stroke, difficult to control hypertension, treatment resistant obesity, arrhythmias, especially irregular heartbeat commonly known as A. Fib. (atrial fibrillation); even type 2 diabetes has been linked to untreated OSA.   Sleep apnea can cause disruption of sleep and sleep deprivation in most cases, which, in turn, can cause recurrent headaches, problems with memory, mood, concentration, focus, and vigilance. Most people with untreated sleep apnea report excessive daytime sleepiness, which can affect their ability to drive. Please do not drive if you feel sleepy. Patients with sleep apnea developed difficulty initiating and maintaining sleep (aka insomnia).   Having sleep apnea may increase your risk for other sleep disorders, including involuntary behaviors sleep such as sleep terrors,  sleep talking, sleepwalking.    Having sleep apnea can also increase your risk for restless leg syndrome and leg movements at night.   Please note that untreated obstructive sleep apnea may carry additional perioperative morbidity. Patients with significant obstructive sleep apnea (typically, in the moderate to severe degree) should receive, if possible, perioperative PAP (positive airway pressure) therapy and the surgeons and particularly the anesthesiologists should be informed of the diagnosis and the severity of the sleep disordered breathing.   I will likely see you back after your sleep study to go over the test results and where to go from there. We will call you after your sleep study to advise about the results (most likely, you will hear from Kristen, my nurse) and to set up an appointment at the time, as necessary.    Our sleep lab administrative assistant will call you to schedule your sleep study and give you further instructions, regarding chicken process with a sleep study, arrival time, what to bring, when you can expect to leave after the study, and to answer any other logistical questions you may have. If you don't hear back from her by about 2 weeks from now, please feel free to call her direct line at [phone] or you can call our general clinic number, or email us through My Chart.   

## 2017-05-19 NOTE — Progress Notes (Signed)
Subjective:  Patient ID: Julie Hardy, female    DOB: [birth year]  Age: 47 y.o. MRN: [id number]  CC: Abdominal Pain   HPI Julie Hardy presents for evaluation of a possible fluoxetine side effect.  The last week stools have been straining.  Her stomach is been bloated.  There is been no constipation or watery diarrhea.  There has been times where the stools seem to have been a little oily and it is been difficult to distinguish gas from bowel movement.  Patient denies fever chills nausea or vomiting.  There is no fatigue or malaise.  She has been taking the fluoxetine for about 3 weeks now and does feel as though she is calm her.  It is definitely taking the edge off of some of the stress that she is been feeling.  She has not taken any of the Lunesta as of yet.  She did meet with a sleep doctor to arrange a sleep study.  She continues with counseling.  School will be out soon.  She will take a week vacation and then work this summer until the week before school starts again.  Outpatient Medications Prior to Visit  Medication Sig Dispense Refill  . Artificial Tear Ointment (DRY EYES OP) Apply 1 drop to eye as needed.    Marland Kitchen FLUoxetine (PROZAC) 10 MG capsule One capsule daily for one week and then increase to two daily. 60 capsule 1  . ibuprofen (ADVIL) 200 MG tablet Take 400 mg by mouth at bedtime.    Marland Kitchen loratadine (CLARITIN) 10 MG tablet Take 10 mg by mouth at bedtime.    . Vitamin D, Ergocalciferol, (DRISDOL) 50000 units CAPS capsule Take 1 capsule (50,000 Units total) by mouth every 7 (seven) days. 30 capsule 0   No facility-administered medications prior to visit.     ROS Review of Systems  Constitutional: Negative.  Negative for fatigue, fever and unexpected weight change.  Gastrointestinal: Positive for abdominal distention. Negative for abdominal pain, anal bleeding, blood in stool, constipation, diarrhea, nausea, rectal pain and vomiting.  Musculoskeletal: Negative for  arthralgias and myalgias.  Skin: Negative for color change and pallor.  Allergic/Immunologic: Negative for immunocompromised state.  Neurological: Negative for weakness and headaches.    Objective:  BP 134/80   Temp (!) 97.5 F (36.4 C)   Ht  (1.626 m)   Wt 155 lb (70.3 kg)   BMI 26.61 kg/m   BP Readings from Last 3 Encounters:  05/19/17 134/80  05/19/17 (!) 151/94  04/25/17 118/70    Wt Readings from Last 3 Encounters:  05/19/17 155 lb (70.3 kg)  05/19/17 155 lb (70.3 kg)  04/25/17 156 lb 6 oz (70.9 kg)    Physical Exam  Constitutional: She is oriented to person, place, and time. She appears well-developed and well-nourished.  Non-toxic appearance. She does not appear ill. No distress.  HENT:  Head: Normocephalic and atraumatic.  Eyes: No scleral icterus.  Pulmonary/Chest: Effort normal.  Neurological: She is alert and oriented to person, place, and time.  Psychiatric: She has a normal mood and affect. Her behavior is normal.   Depression screen [facility] 2/9 05/19/2017 03/17/2017  Decreased Interest 1 0  Down, Depressed, Hopeless 0 1  PHQ - 2 Score 1 1  Altered sleeping 2 3  Tired, decreased energy 1 3  Change in appetite 1 2  Feeling bad or failure about yourself  0 1  Trouble concentrating 1 1  Moving slowly or fidgety/restless 1 1  Suicidal thoughts 0 0  PHQ-9 Score 7 12     Lab Results  Component Value Date   WBC 8.8 03/11/2017   HGB 12.8 03/11/2017   HCT 37.2 03/11/2017   PLT 246 03/11/2017   GLUCOSE 83 03/11/2017   ALT 14 05/22/2012   AST 14 05/22/2012   NA 141 03/11/2017   K 4.0 03/11/2017   CL 107 03/11/2017   CREATININE 0.87 03/11/2017   BUN 16 03/11/2017   CO2 27 03/11/2017   INR 0.9 10/03/2008    Dg Chest 2 View  Result Date: 03/11/2017 CLINICAL DATA:  47 y/o F; worsening chest pain, shortness of breath, lightheadedness. EXAM: CHEST - 2 VIEW COMPARISON:  None. FINDINGS: The heart size and mediastinal contours are within normal limits.  Both lungs are clear. The visualized skeletal structures are unremarkable. IMPRESSION: No acute pulmonary process identified. Electronically Signed   By: Mitzi Hansen M.D.   On: 03/11/2017 16:09    Assessment & Plan:   Anaisabel was seen today for abdominal pain.  Diagnoses and all orders for this visit:  Stress  Irritable bowel syndrome, unspecified type   I am having Julie Hardy maintain her ibuprofen, loratadine, Artificial Tear Ointment (DRY EYES OP), Vitamin D (Ergocalciferol), and FLUoxetine.  No orders of the defined types were placed in this encounter.  Believe that she is doing better with the Prozac.  She will follow-up with me sometime before school starts again in the fall.  Suggested that she use simethicone or Gas-X for her bloating.  She will follow-up sooner as needed.  She continues with counseling.  Follow-up: Return in about 3 months (around 08/19/2017).  Mliss Sax, MD

## 2017-05-19 NOTE — Progress Notes (Signed)
Subjective:    Patient ID: Julie Hardy is a 47 y.o. female.  HPI     Huston Foley, MD, PhD [facility] [address] [address] [place], [place] [address]   Dear Dr. Doreene Burke,   I saw your patient, Julie Hardy, upon your kind request in my neurologic clinic today for initial consultation of her sleep disorder, including daytime somnolence, difficulty maintaining sleep at night, history of snoring, concern for sleep apnea. The patient is unaccompanied today. As you know, Julie Hardy is a 47 year old right-handed woman with an underlying medical history of allergies, depression, history of migraines and borderline overweight state, who reports chronic sleep difficulties, primarily difficulty maintaining sleep. She has a tendency to wake up between 2 and 3 AM and difficulty going back to sleep. She does not have night to night nocturia, has had occasional morning headaches and has a history of migraines which improved with time. She used to be on Topamax for migraine prevention. She has a 58-year-old biological daughter and a 33 year old Museum/gallery conservator.  She has had recent stress an increase in depressive symptoms. She is seeing a counselor which is helping with stress reduction and she also recently started Prozac low-dose, it has been about 3 weeks and she feels that it has already been helpful. For sleep difficulties she has tried trazodone which caused side effects, she admits that she only took it one night. She has been given a prescription for Lunesta but has not tried it yet. She is concerned about side effects.  She does not endorse any telltale symptoms of RLS. She does not endorse any parasomnias. I reviewed your office note from 04/25/2017. Her Epworth sleepiness score is 14 out of 24, fatigue score is 47 out of 63. She is married and lives with her husband. They have 2 children. 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 She is a nonsmoker and drinks alcohol in the form of wine, 1-2 glasses in the evenings, sometimes a little more on the weekends. She drinks caffeine in the form of diet soda about 32 ounces per day or so.she does not typically drink tea or coffee. Her mother has sleep apnea but could not tolerate a CPAP machine. Both parents snore.  Her Past Medical History Is Significant For: Past Medical History:  Diagnosis Date  . Migraine     Her Past Surgical History Is Significant For: No past surgical history on file.  Her Family History Is Significant For: Family History  Problem Relation Age of Onset  . Cancer Mother   . Hearing loss Mother   . Alcohol abuse Father   . Arthritis Father   . Hearing loss Father   . Heart disease Father   . Hyperlipidemia Father     Her Social History Is Significant For: Social History   Socioeconomic History  . Marital status: Married    Spouse name: Not on file  . Number of children: Not on file  . Years of education: Not on file  . Highest education level: Not on file  Occupational History  . Not on file  Social Needs  . Financial resource strain: Not on file  . Food insecurity:    Worry: Not on file    Inability: Not on file  . Transportation needs:    Medical: Not on file    Non-medical: Not on file  Tobacco Use  . Smoking status: Never Smoker  . Smokeless tobacco: Never Used  Substance and Sexual Activity  . Alcohol use: Yes    Alcohol/week: 4.2 oz    Types: 7 Glasses of wine per week    Comment: social  . Drug use: No  . Sexual activity: Yes    Partners: Male  Lifestyle  . Physical activity:    Days per  week: Not on file    Minutes per session: Not on file  . Stress: Not on file  Relationships  . Social connections:    Talks on phone: Not on file    Gets together: Not on file    Attends religious service: Not on file    Active member of club or organization: Not on file    Attends meetings of clubs or organizations: Not on file    Relationship status: Not on file  Other Topics Concern  . Not on file  Social History Narrative  . Not on file    Her Allergies Are:  Allergies  Allergen Reactions  . Amoxicillin Hives  . Chocolate Flavor Other (See Comments)    headache  :   Her Current Medications Are:  Outpatient Encounter Medications as of 05/19/2017  Medication Sig  . Artificial Tear Ointment (DRY EYES OP) Apply 1 drop to eye as needed.  Marland Kitchen FLUoxetine (PROZAC) 10 MG capsule One capsule daily for one week and then increase to two daily.  Marland Kitchen ibuprofen (ADVIL) 200 MG tablet Take 400 mg by mouth at bedtime.  Marland Kitchen loratadine (CLARITIN) 10 MG tablet Take 10 mg by mouth at bedtime.  . Vitamin D, Ergocalciferol, (DRISDOL) 50000 units CAPS capsule Take 1 capsule (50,000 Units total) by mouth every 7 (seven) days.  . [DISCONTINUED] eszopiclone (LUNESTA) 2 MG TABS tablet Take 1 tablet (2 mg total) by mouth at bedtime as needed for sleep. Take immediately before bedtime  . [DISCONTINUED] traZODone (DESYREL) 50 MG tablet Take 0.5-1 tablets (25-50 mg total) by mouth at bedtime as needed for sleep.   No facility-administered encounter medications on file as of 05/19/2017.   :  Review of Systems:  Out of a complete 14 point review of systems, all are reviewed and negative with the exception of these symptoms as listed below: Review of Systems  Neurological:       Pt presents  today to discuss her sleep. Pt has never had a sleep study but does endorse snoring.  Epworth Sleepiness Scale 0= would never doze 1= slight chance of dozing 2= moderate chance of dozing 3= high chance of  dozing  Sitting and reading: 2 Watching TV: 1 Sitting inactive in a public place (ex. Theater or meeting): 1 As a passenger in a car for an hour without a break: 3 Lying down to rest in the afternoon: 3 Sitting and talking to someone: 1 Sitting quietly after lunch (no alcohol): 1 In a car, while stopped in traffic: 2 Total: 14     Objective:  Neurological Exam  Physical Exam Physical Examination:   Vitals:   05/19/17 0859  BP: (!) 151/94  Pulse: 72   General Examination: The patient is a very pleasant 47 y.o. female in no acute distress. She appears well-developed and well-nourished and well groomed.   HEENT: Normocephalic, atraumatic, pupils are equal, round and reactive to light and accommodation. prescription eye glasses in place. Funduscopic exam is normal with sharp disc margins noted. Extraocular tracking is good without limitation to gaze excursion or nystagmus noted. Normal smooth pursuit is noted. Hearing is grossly intact.  Face is symmetric with normal facial animation and normal facial sensation. Speech is clear with no dysarthria noted. There is no hypophonia. There is no lip, neck/head, jaw or voice tremor. Neck is supple with full range of passive and active motion. There are no carotid bruits on auscultation. Oropharynx exam reveals: mild mouth dryness, adequate dental hygiene and mild airway crowding, due to tonsils in place of about 1+ in size but quite small airway entry. Uvula is small. Mallampati is class II. Neck circumference is 12-5/8 inches. She has a mild overbite. Tongue protrudes centrally and palate elevates symmetrically.    Chest: Clear to auscultation without wheezing, rhonchi or crackles noted.  Heart: S1+S2+0, regular and normal without murmurs, rubs or gallops noted.   Abdomen: Soft, non-tender and non-distended with normal bowel sounds appreciated on auscultation.  Extremities: There is no pitting edema in the distal lower extremities  bilaterally. Pedal pulses are intact.  Skin: Warm and dry without trophic changes noted.  Musculoskeletal: exam reveals no obvious joint deformities, tenderness or joint swelling or erythema.   Neurologically:  Mental status: The patient is awake, alert and oriented in all 4 spheres. Her immediate and remote memory, attention, language skills and fund of knowledge are appropriate. There is no evidence of aphasia, agnosia, apraxia or anomia. Speech is clear with normal prosody and enunciation. Thought process is linear. Mood is normal and affect is normal.  Cranial nerves II - XII are as described above under HEENT exam. In addition: shoulder shrug is normal with equal shoulder height noted. Motor exam: Normal bulk, strength and tone is noted. There is no drift, tremor or rebound. Romberg is negative. Reflexes are 2+ throughout. Fine motor skills and coordination: intact with normal finger taps, normal hand movements, normal rapid alternating patting, normal foot taps and normal foot agility.  Cerebellar testing: No dysmetria or intention tremor on finger to nose testing. Heel to shin is unremarkable bilaterally. There is no truncal or gait ataxia.  Sensory exam: intact to light touch in the upper and lower extremities.  Gait, station and balance: She stands easily. No veering to one side is noted. No leaning to one side is noted. Posture is age-appropriate and stance is narrow based. Gait shows normal stride length and normal pace. No problems turning are noted.  Tandem walk is slightly challenging in the beginning.   Assessment and Plan:  In summary, Julie Hardy is a very pleasant 47 y.o.-year old female with an underlying medical history of allergies, depression, history of migraines and borderline overweight state, who presents for sleep consultation for chronic sleep difficulties. Given her family history of obstructive sleep apnea, her own snoring, morning headaches reported and sleep  disruption, I would like to proceed with a sleep study to rule out an organic sleep disorder causing her sleep disturbance, in particular to help rule out obstructive sleep apnea.  Ultimately, for chronic sleep maintenance difficulties, she may do well with cognitive behavioral therapy. She is encouraged to bring this up with her counselor as well. She may be able to try the [facility] on an as-needed basis. I had a long chat with the patient about my findings and the diagnosis of OSA, its prognosis and treatment options. We talked about medical treatments, surgical interventions and non-pharmacological approaches. I explained in particular the risks and ramifications of untreated moderate to severe OSA, especially with respect to developing cardiovascular disease down the Road, including congestive heart failure, difficult to treat hypertension, cardiac arrhythmias, or stroke. Even type 2 diabetes has, in part, been linked to untreated OSA. Symptoms of untreated OSA include daytime sleepiness, memory problems, mood irritability and mood disorder such as depression and anxiety, lack of energy, as well as recurrent headaches, especially morning headaches. We talked about trying to maintain a healthy lifestyle in general, as well as the importance of weight control. I encouraged the patient to eat healthy, exercise daily and keep well hydrated, to keep a scheduled bedtime and wake time routine, to not skip any meals and eat healthy snacks in between meals. I advised the patient not to drive when feeling sleepy. I recommended the following at this time: sleep study with potential positive airway pressure titration. (We will score hypopneas at 3%). If she decides to try the [facility], she is advised not to combine with alcohol or drink alcohol in close proximity to taking her sleeping pill.   I explained the sleep test procedure to the patient and also outlined possible surgical and non-surgical treatment options of  OSA, including the use of a custom-made dental device (which would require a referral to a specialist dentist or oral surgeon), upper airway surgical options, such as pillar implants, radiofrequency surgery, tongue base surgery, and UPPP (which would involve a referral to an ENT surgeon). Rarely, jaw surgery such as mandibular advancement may be considered.  I also explained the CPAP treatment option to the patient, who indicated that she would be willing to try CPAP if the need arises. I explained the importance of being compliant with PAP treatment, not only for insurance purposes but primarily to improve Her symptoms, and for the patient's long term health benefit, including to reduce Her cardiovascular risks. I answered all her questions today and the patient was in agreement. I would like to see her back after the sleep study is completed and encouraged her to call with any interim questions, concerns, problems or updates.   Thank you very much for allowing me to participate in the care of this nice patient. If I can be of any further assistance to you please do not hesitate to call me at [phone].  Sincerely,   Huston Foley, MD, PhD

## 2017-06-01 ENCOUNTER — Telehealth: Payer: Self-pay

## 2017-06-01 NOTE — Telephone Encounter (Signed)
We have attempted to call the patient two times to schedule sleep study.  Patient has been unavailable at the phone numbers we have on file and has not returned our calls.  At this point we will send a letter asking patient to please contact the sleep lab to schedule their sleep study.  If patient calls back we will schedule them for their sleep study. 

## 2017-07-13 ENCOUNTER — Other Ambulatory Visit: Payer: Self-pay | Admitting: Family Medicine

## 2017-07-13 DIAGNOSIS — F320 Major depressive disorder, single episode, mild: Secondary | ICD-10-CM

## 2017-10-01 ENCOUNTER — Other Ambulatory Visit: Payer: Self-pay | Admitting: Family Medicine

## 2017-10-01 DIAGNOSIS — E559 Vitamin D deficiency, unspecified: Secondary | ICD-10-CM

## 2017-10-01 DIAGNOSIS — F5101 Primary insomnia: Secondary | ICD-10-CM

## 2017-12-20 ENCOUNTER — Other Ambulatory Visit: Payer: Self-pay | Admitting: Family Medicine

## 2017-12-20 DIAGNOSIS — F320 Major depressive disorder, single episode, mild: Secondary | ICD-10-CM

## 2018-01-19 ENCOUNTER — Ambulatory Visit: Payer: BC Managed Care – PPO | Admitting: Family Medicine

## 2018-04-27 ENCOUNTER — Telehealth: Payer: Self-pay | Admitting: Family Medicine

## 2018-04-27 NOTE — Telephone Encounter (Signed)
Called patient on behalf of Dr Doreene Burke because it has been a while since last visit, left message to see if she would be interested in virtual visit/

## 2018-05-06 ENCOUNTER — Other Ambulatory Visit: Payer: Self-pay | Admitting: Family Medicine

## 2018-05-06 DIAGNOSIS — F320 Major depressive disorder, single episode, mild: Secondary | ICD-10-CM

## 2018-05-08 NOTE — Telephone Encounter (Signed)
Needs OV.  

## 2018-06-27 ENCOUNTER — Other Ambulatory Visit: Payer: Self-pay | Admitting: Family Medicine

## 2018-06-27 DIAGNOSIS — F320 Major depressive disorder, single episode, mild: Secondary | ICD-10-CM

## 2018-08-02 ENCOUNTER — Encounter: Payer: Self-pay | Admitting: Family Medicine

## 2018-08-02 ENCOUNTER — Ambulatory Visit (INDEPENDENT_AMBULATORY_CARE_PROVIDER_SITE_OTHER): Payer: BC Managed Care – PPO | Admitting: Family Medicine

## 2018-08-02 ENCOUNTER — Other Ambulatory Visit: Payer: Self-pay | Admitting: Family Medicine

## 2018-08-02 VITALS — Ht 64.0 in

## 2018-08-02 DIAGNOSIS — Z20822 Contact with and (suspected) exposure to covid-19: Secondary | ICD-10-CM | POA: Insufficient documentation

## 2018-08-02 DIAGNOSIS — Z20828 Contact with and (suspected) exposure to other viral communicable diseases: Secondary | ICD-10-CM | POA: Diagnosis not present

## 2018-08-02 NOTE — Progress Notes (Signed)
Established Patient Office Visit  Subjective:  Patient ID: Julie LopeMeredith E Smithey, female    DOB: 07/30/1970  Age: 48 y.o. MRN: [id number]  CC:  Chief Complaint  Patient presents with  . exposed to covid    HPI Julie Hardy presents for evaluation of a COVID exposure 13 days ago.  Was vacationing and her 46-year-old daughter's babysitter just inform them that she had tested positive for COVID.  Patient is having no symptoms including fevers chills headache stuffy nose drainage sore throat or cough.  There is certainly no shortness of breath difficulty breathing.  Past Medical History:  Diagnosis Date  . Migraine     History reviewed. No pertinent surgical history.  Family History  Problem Relation Age of Onset  . Cancer Mother   . Hearing loss Mother   . Alcohol abuse Father   . Arthritis Father   . Hearing loss Father   . Heart disease Father   . Hyperlipidemia Father     Social History   Socioeconomic History  . Marital status: Married    Spouse name: Not on file  . Number of children: Not on file  . Years of education: Not on file  . Highest education level: Not on file  Occupational History  . Not on file  Social Needs  . Financial resource strain: Not on file  . Food insecurity    Worry: Not on file    Inability: Not on file  . Transportation needs    Medical: Not on file    Non-medical: Not on file  Tobacco Use  . Smoking status: Never Smoker  . Smokeless tobacco: Never Used  Substance and Sexual Activity  . Alcohol use: Yes    Alcohol/week: 7.0 standard drinks    Types: 7 Glasses of wine per week    Comment: social  . Drug use: No  . Sexual activity: Yes    Partners: Male  Lifestyle  . Physical activity    Days per week: Not on file    Minutes per session: Not on file  . Stress: Not on file  Relationships  . Social Musicianconnections    Talks on phone: Not on file    Gets together: Not on file    Attends religious service: Not on file   Active member of club or organization: Not on file    Attends meetings of clubs or organizations: Not on file    Relationship status: Not on file  . Intimate partner violence    Fear of current or ex partner: Not on file    Emotionally abused: Not on file    Physically abused: Not on file    Forced sexual activity: Not on file  Other Topics Concern  . Not on file  Social History Narrative  . Not on file    Outpatient Medications Prior to Visit  Medication Sig Dispense Refill  . Artificial Tear Ointment (DRY EYES OP) Apply 1 drop to eye as needed.    . eszopiclone (LUNESTA) 2 MG TABS tablet TAKE 1 TABLET BY MOUTH EVERY NIGHT AT BEDTIME AS NEEDED FOR SLEEP 30 tablet 0  . ibuprofen (ADVIL) 200 MG tablet Take 400 mg by mouth at bedtime.    Marland Kitchen. loratadine (CLARITIN) 10 MG tablet Take 10 mg by mouth at bedtime.    . Vitamin D, Ergocalciferol, (DRISDOL) 50000 units CAPS capsule TAKE 1 CAPSULE BY MOUTH EVERY 7 DAYS 30 capsule 0  . FLUoxetine (PROZAC) 10 MG capsule TAKE  1 CAPSULE(10 MG) BY MOUTH TWICE DAILY 30 capsule 0   No facility-administered medications prior to visit.     Allergies  Allergen Reactions  . Amoxicillin Hives  . Chocolate Flavor Other (See Comments)    headache    ROS Review of Systems  Constitutional: Negative for chills, diaphoresis, fatigue, fever and unexpected weight change.  HENT: Negative for congestion, postnasal drip and sore throat.   Eyes: Negative for photophobia and visual disturbance.  Respiratory: Negative for cough, shortness of breath and wheezing.   Cardiovascular: Negative.   Musculoskeletal: Negative for arthralgias and myalgias.  Neurological: Negative for headaches.      Objective:    Physical Exam  Constitutional: She is oriented to person, place, and time. She appears well-developed and well-nourished. No distress.  HENT:  Head: Normocephalic and atraumatic.  Right Ear: External ear normal.  Left Ear: External ear normal.   Mouth/Throat: Oropharynx is clear and moist.  Eyes: Conjunctivae are normal. Right eye exhibits no discharge. Left eye exhibits no discharge. No scleral icterus.  Neck: No JVD present. No tracheal deviation present.  Pulmonary/Chest: Effort normal.  Neurological: She is alert and oriented to person, place, and time.  Skin: Skin is warm and dry. She is not diaphoretic.  Psychiatric: She has a normal mood and affect. Her behavior is normal.    Ht 5\' 4"  (1.626 m)   BMI 26.61 kg/m  Wt Readings from Last 3 Encounters:  05/19/17 155 lb (70.3 kg)  05/19/17 155 lb (70.3 kg)  04/25/17 156 lb 6 oz (70.9 kg)   BP Readings from Last 3 Encounters:  05/19/17 134/80  05/19/17 (!) 151/94  04/25/17 118/70   Guideline developer:  UpToDate (see UpToDate for funding source) Date Released: June 2014  Health Maintenance Due  Topic Date Due  . HIV Screening  06/11/1987  . TETANUS/TDAP  06/11/1991  . PAP SMEAR-Modifier  06/10/1993    There are no preventive care reminders to display for this patient.  No results found for: TSH Lab Results  Component Value Date   WBC 8.8 03/11/2017   HGB 12.8 03/11/2017   HCT 37.2 03/11/2017   MCV 92.8 03/11/2017   PLT 246 03/11/2017   Lab Results  Component Value Date   NA 141 03/11/2017   K 4.0 03/11/2017   CO2 27 03/11/2017   GLUCOSE 83 03/11/2017   BUN 16 03/11/2017   CREATININE 0.87 03/11/2017   ALKPHOS 55 05/22/2012   AST 14 05/22/2012   ALT 14 05/22/2012   CALCIUM 9.1 03/11/2017   ANIONGAP 7 03/11/2017   No results found for: CHOL No results found for: HDL No results found for: LDLCALC No results found for: TRIG No results found for: CHOLHDL No results found for: [id number]    Assessment & Plan:   Problem List Items Addressed This Visit      Other   Viral disease exposure   Exposure to Covid-19 Virus - Primary   Relevant Orders   Novel Coronavirus, NAA ([facility])      No orders of the defined types were placed in this encounter.    Follow-up: Return if symptoms worsen or fail to improve.   We will send patient for rapid SARS COVID-19 virus testing.  Anticipate negative result.  Patient will follow-up otherwise or with any respiratory tract symptoms.  Virtual Visit via Video Note  I connected with Julie Hardy on 08/02/18 at 10:30 AM EDT by a video enabled telemedicine application and verified that I am  speaking with the correct person using two identifiers.  Location: Patient: home Provider:    I discussed the limitations of evaluation and management by telemedicine and the availability of in person appointments. The patient expressed understanding and agreed to proceed.  History of Present Illness:    Observations/Objective:   Assessment and Plan:   Follow Up Instructions:    I discussed the assessment and treatment plan with the patient. The patient was provided an opportunity to ask questions and all were answered. The patient agreed with the plan and demonstrated an understanding of the instructions.   The patient was advised to call back or seek an in-person evaluation if the symptoms worsen or if the condition fails to improve as anticipated.  I provided12 minutes of non-face-to-face time during this encounter.   Libby Maw, MD

## 2018-08-03 ENCOUNTER — Other Ambulatory Visit: Payer: Self-pay | Admitting: Family Medicine

## 2018-08-03 DIAGNOSIS — F320 Major depressive disorder, single episode, mild: Secondary | ICD-10-CM

## 2018-08-03 LAB — NOVEL CORONAVIRUS, NAA: SARS-CoV-2, NAA: NOT DETECTED

## 2018-08-04 ENCOUNTER — Encounter: Payer: Self-pay | Admitting: Family Medicine

## 2018-09-02 ENCOUNTER — Other Ambulatory Visit: Payer: Self-pay | Admitting: Family Medicine

## 2018-09-02 DIAGNOSIS — F320 Major depressive disorder, single episode, mild: Secondary | ICD-10-CM

## 2018-10-10 ENCOUNTER — Other Ambulatory Visit: Payer: Self-pay | Admitting: Family Medicine

## 2018-10-10 DIAGNOSIS — F320 Major depressive disorder, single episode, mild: Secondary | ICD-10-CM

## 2018-10-26 ENCOUNTER — Other Ambulatory Visit: Payer: Self-pay

## 2018-10-26 DIAGNOSIS — Z20822 Contact with and (suspected) exposure to covid-19: Secondary | ICD-10-CM

## 2018-10-28 ENCOUNTER — Encounter: Payer: Self-pay | Admitting: Family Medicine

## 2018-10-28 LAB — NOVEL CORONAVIRUS, NAA: SARS-CoV-2, NAA: DETECTED — AB

## 2018-11-02 ENCOUNTER — Encounter: Payer: Self-pay | Admitting: Family Medicine

## 2018-11-08 ENCOUNTER — Encounter: Payer: Self-pay | Admitting: Family Medicine

## 2018-11-09 ENCOUNTER — Encounter: Payer: Self-pay | Admitting: Family Medicine

## 2018-11-11 ENCOUNTER — Other Ambulatory Visit: Payer: Self-pay | Admitting: Family Medicine

## 2018-11-11 DIAGNOSIS — F320 Major depressive disorder, single episode, mild: Secondary | ICD-10-CM

## 2019-02-16 ENCOUNTER — Other Ambulatory Visit: Payer: Self-pay | Admitting: Family Medicine

## 2019-02-16 DIAGNOSIS — F320 Major depressive disorder, single episode, mild: Secondary | ICD-10-CM

## 2019-02-16 NOTE — Telephone Encounter (Signed)
Left voicemail for patient to call back, need to make an appointment for prescription.

## 2019-04-02 ENCOUNTER — Other Ambulatory Visit: Payer: Self-pay

## 2019-04-03 ENCOUNTER — Ambulatory Visit: Payer: BC Managed Care – PPO | Admitting: Family Medicine

## 2019-04-03 ENCOUNTER — Encounter: Payer: Self-pay | Admitting: Family Medicine

## 2019-04-03 VITALS — BP 118/76 | HR 87 | Temp 97.4°F | Ht 64.0 in | Wt 164.2 lb

## 2019-04-03 DIAGNOSIS — Z0000 Encounter for general adult medical examination without abnormal findings: Secondary | ICD-10-CM

## 2019-04-03 DIAGNOSIS — F324 Major depressive disorder, single episode, in partial remission: Secondary | ICD-10-CM | POA: Insufficient documentation

## 2019-04-03 DIAGNOSIS — E875 Hyperkalemia: Secondary | ICD-10-CM

## 2019-04-03 DIAGNOSIS — N951 Menopausal and female climacteric states: Secondary | ICD-10-CM | POA: Diagnosis not present

## 2019-04-03 DIAGNOSIS — E559 Vitamin D deficiency, unspecified: Secondary | ICD-10-CM

## 2019-04-03 DIAGNOSIS — Z1211 Encounter for screening for malignant neoplasm of colon: Secondary | ICD-10-CM | POA: Insufficient documentation

## 2019-04-03 MED ORDER — FLUOXETINE HCL 20 MG PO CAPS: 20.0000 mg | ORAL_CAPSULE | Freq: Every day | ORAL | 1 refills | Status: DC

## 2019-04-03 NOTE — Patient Instructions (Signed)
Health Maintenance, Female Adopting a healthy lifestyle and getting preventive care are important in promoting health and wellness. Ask your health care provider about:  The right schedule for you to have regular tests and exams.  Things you can do on your own to prevent diseases and keep yourself healthy. What should I know about diet, weight, and exercise? Eat a healthy diet   Eat a diet that includes plenty of vegetables, fruits, low-fat dairy products, and lean protein.  Do not eat a lot of foods that are high in solid fats, added sugars, or sodium. Maintain a healthy weight Body mass index (BMI) is used to identify weight problems. It estimates body fat based on height and weight. Your health care provider can help determine your BMI and help you achieve or maintain a healthy weight. Get regular exercise Get regular exercise. This is one of the most important things you can do for your health. Most adults should:  Exercise for at least 150 minutes each week. The exercise should increase your heart rate and make you sweat (moderate-intensity exercise).  Do strengthening exercises at least twice a week. This is in addition to the moderate-intensity exercise.  Spend less time sitting. Even light physical activity can be beneficial. Watch cholesterol and blood lipids Have your blood tested for lipids and cholesterol at 47 years of age, then have this test every 5 years. Have your cholesterol levels checked more often if:  Your lipid or cholesterol levels are high.  You are older than 47 years of age.  You are at high risk for heart disease. What should I know about cancer screening? Depending on your health history and family history, you may need to have cancer screening at various ages. This may include screening for:  Breast cancer.  Cervical cancer.  Colorectal cancer.  Skin cancer.  Lung cancer. What should I know about heart disease, diabetes, and high blood  pressure? Blood pressure and heart disease  High blood pressure causes heart disease and increases the risk of stroke. This is more likely to develop in people who have high blood pressure readings, are of African descent, or are overweight.  Have your blood pressure checked: ? Every 3-5 years if you are 54-62 years of age. ? Every year if you are 93 years old or older. Diabetes Have regular diabetes screenings. This checks your fasting blood sugar level. Have the screening done:  Once every three years after age 69 if you are at a normal weight and have a low risk for diabetes.  More often and at a younger age if you are overweight or have a high risk for diabetes. What should I know about preventing infection? Hepatitis B If you have a higher risk for hepatitis B, you should be screened for this virus. Talk with your health care provider to find out if you are at risk for hepatitis B infection. Hepatitis C Testing is recommended for:  Everyone born from [birth year] through 1965.  Anyone with known risk factors for hepatitis C. Sexually transmitted infections (STIs)  Get screened for STIs, including gonorrhea and chlamydia, if: ? You are sexually active and are younger than 47 years of age. ? You are older than 47 years of age and your health care provider tells you that you are at risk for this type of infection. ? Your sexual activity has changed since you were last screened, and you are at increased risk for chlamydia or gonorrhea. Ask your health care provider if  you are at risk.  Ask your health care provider about whether you are at high risk for HIV. Your health care provider may recommend a prescription medicine to help prevent HIV infection. If you choose to take medicine to prevent HIV, you should first get tested for HIV. You should then be tested every 3 months for as long as you are taking the medicine. Pregnancy  If you are about to stop having your period (premenopausal) and  you may become pregnant, seek counseling before you get pregnant.  Take 400 to 800 micrograms (mcg) of folic acid every day if you become pregnant.  Ask for birth control (contraception) if you want to prevent pregnancy. Osteoporosis and menopause Osteoporosis is a disease in which the bones lose minerals and strength with aging. This can result in bone fractures. If you are 16 years old or older, or if you are at risk for osteoporosis and fractures, ask your health care provider if you should:  Be screened for bone loss.  Take a calcium or vitamin D supplement to lower your risk of fractures.  Be given hormone replacement therapy (HRT) to treat symptoms of menopause. Follow these instructions at home: Lifestyle  Do not use any products that contain nicotine or tobacco, such as cigarettes, e-cigarettes, and chewing tobacco. If you need help quitting, ask your health care provider.  Do not use street drugs.  Do not share needles.  Ask your health care provider for help if you need support or information about quitting drugs. Alcohol use  Do not drink alcohol if: ? Your health care provider tells you not to drink. ? You are pregnant, may be pregnant, or are planning to become pregnant.  If you drink alcohol: ? Limit how much you use to 0-1 drink a day. ? Limit intake if you are breastfeeding.  Be aware of how much alcohol is in your drink. In the [place] one drink equals one 12 oz bottle of beer (355 mL), one 5 oz glass of wine (148 mL), or one 1 oz glass of hard liquor (44 mL). General instructions  Schedule regular health, dental, and eye exams.  Stay current with your vaccines.  Tell your health care provider if: ? You often feel depressed. ? You have ever been abused or do not feel safe at home. Summary  Adopting a healthy lifestyle and getting preventive care are important in promoting health and wellness.  Follow your health care provider's instructions about healthy  diet, exercising, and getting tested or screened for diseases.  Follow your health care provider's instructions on monitoring your cholesterol and blood pressure. This information is not intended to replace advice given to you by your health care provider. Make sure you discuss any questions you have with your health care provider. Document Revised: 12/14/2017 Document Reviewed: 12/14/2017 Elsevier Patient Education  2020 [place] is the normal time of life before and after menstrual periods stop completely (menopause). Perimenopause can begin 2-8 years before menopause, and it usually lasts for 1 year after menopause. During perimenopause, the ovaries may or may not produce an egg. What are the causes? This condition is caused by a natural change in hormone levels that happens as you get older. What increases the risk? This condition is more likely to start at an earlier age if you have certain medical conditions or treatments, including:  A tumor of the pituitary gland in the brain.  A disease that affects the ovaries and hormone  production.  Radiation treatment for cancer.  Certain cancer treatments, such as chemotherapy or hormone (anti-estrogen) therapy.  Heavy smoking and excessive alcohol use.  Family history of early menopause. What are the signs or symptoms? Perimenopausal changes affect each woman differently. Symptoms of this condition may include:  Hot flashes.  Night sweats.  Irregular menstrual periods.  Decreased sex drive.  Vaginal dryness.  Headaches.  Mood swings.  Depression.  Memory problems or trouble concentrating.  Irritability.  Tiredness.  Weight gain.  Anxiety.  Trouble getting pregnant. How is this diagnosed? This condition is diagnosed based on your medical history, a physical exam, your age, your menstrual history, and your symptoms. Hormone tests may also be done. How is this treated? In some  cases, no treatment is needed. You and your health care provider should make a decision together about whether treatment is necessary. Treatment will be based on your individual condition and preferences. Various treatments are available, such as:  Menopausal hormone therapy (MHT).  Medicines to treat specific symptoms.  Acupuncture.  Vitamin or herbal supplements. Before starting treatment, make sure to let your health care provider know if you have a personal or family history of:  Heart disease.  Breast cancer.  Blood clots.  Diabetes.  Osteoporosis. Follow these instructions at home: Lifestyle  Do not use any products that contain nicotine or tobacco, such as cigarettes and e-cigarettes. If you need help quitting, ask your health care provider.  Eat a balanced diet that includes fresh fruits and vegetables, whole grains, soybeans, eggs, lean meat, and low-fat dairy.  Get at least 30 minutes of physical activity on 5 or more days each week.  Avoid alcoholic and caffeinated beverages, as well as spicy foods. This may help prevent hot flashes.  Get 7-8 hours of sleep each night.  Dress in layers that can be removed to help you manage hot flashes.  Find ways to manage stress, such as deep breathing, meditation, or journaling. General instructions  Keep track of your menstrual periods, including: ? When they occur. ? How heavy they are and how long they last. ? How much time passes between periods.  Keep track of your symptoms, noting when they start, how often you have them, and how long they last.  Take over-the-counter and prescription medicines only as told by your health care provider.  Take vitamin supplements only as told by your health care provider. These may include calcium, vitamin E, and vitamin D.  Use vaginal lubricants or moisturizers to help with vaginal dryness and improve comfort during sex.  Talk with your health care provider before starting any  herbal supplements.  Keep all follow-up visits as told by your health care provider. This is important. This includes any group therapy or counseling. Contact a health care provider if:  You have heavy vaginal bleeding or pass blood clots.  Your period lasts more than 2 days longer than normal.  Your periods are recurring sooner than 21 days.  You bleed after having sex. Get help right away if:  You have chest pain, trouble breathing, or trouble talking.  You have severe depression.  You have pain when you urinate.  You have severe headaches.  You have vision problems. Summary  Perimenopause is the time when a woman's body begins to move into menopause. This may happen naturally or as a result of other health problems or medical treatments.  Perimenopause can begin 2-8 years before menopause, and it usually lasts for 1 year after menopause.  Perimenopausal symptoms can be managed through medicines, lifestyle changes, and complementary therapies such as acupuncture. This information is not intended to replace advice given to you by your health care provider. Make sure you discuss any questions you have with your health care provider. Document Revised: 12/03/2016 Document Reviewed: 01/27/2016 Elsevier Patient Education  2020 [employer]  Preventive Care 88-45 Years Old, Female Preventive care refers to visits with your health care provider and lifestyle choices that can promote health and wellness. This includes:  A yearly physical exam. This may also be called an annual well check.  Regular dental visits and eye exams.  Immunizations.  Screening for certain conditions.  Healthy lifestyle choices, such as eating a healthy diet, getting regular exercise, not using drugs or products that contain nicotine and tobacco, and limiting alcohol use. What can I expect for my preventive care visit? Physical exam Your health care provider will check your:  Height and weight.  This may be used to calculate body mass index (BMI), which tells if you are at a healthy weight.  Heart rate and blood pressure.  Skin for abnormal spots. Counseling Your health care provider may ask you questions about your:  Alcohol, tobacco, and drug use.  Emotional well-being.  Home and relationship well-being.  Sexual activity.  Eating habits.  Work and work Statistician.  Method of birth control.  Menstrual cycle.  Pregnancy history. What immunizations do I need?  Influenza (flu) vaccine  This is recommended every year. Tetanus, diphtheria, and pertussis (Tdap) vaccine  You may need a Td booster every 10 years. Varicella (chickenpox) vaccine  You may need this if you have not been vaccinated. Zoster (shingles) vaccine  You may need this after age 71. Measles, mumps, and rubella (MMR) vaccine  You may need at least one dose of MMR if you were born in 1957 or later. You may also need a second dose. Pneumococcal conjugate (PCV13) vaccine  You may need this if you have certain conditions and were not previously vaccinated. Pneumococcal polysaccharide (PPSV23) vaccine  You may need one or two doses if you smoke cigarettes or if you have certain conditions. Meningococcal conjugate (MenACWY) vaccine  You may need this if you have certain conditions. Hepatitis A vaccine  You may need this if you have certain conditions or if you travel or work in places where you may be exposed to hepatitis A. Hepatitis B vaccine  You may need this if you have certain conditions or if you travel or work in places where you may be exposed to hepatitis B. Haemophilus influenzae type b (Hib) vaccine  You may need this if you have certain conditions. Human papillomavirus (HPV) vaccine  If recommended by your health care provider, you may need three doses over 6 months. You may receive vaccines as individual doses or as more than one vaccine together in one shot (combination  vaccines). Talk with your health care provider about the risks and benefits of combination vaccines. What tests do I need? Blood tests  Lipid and cholesterol levels. These may be checked every 5 years, or more frequently if you are over 42 years old.  Hepatitis C test.  Hepatitis B test. Screening  Lung cancer screening. You may have this screening every year starting at age 49 if you have a 30-pack-year history of smoking and currently smoke or have quit within the past 15 years.  Colorectal cancer screening. All adults should have this screening starting at age 76 and continuing until age  37. Your health care provider may recommend screening at age 73 if you are at increased risk. You will have tests every 1-10 years, depending on your results and the type of screening test.  Diabetes screening. This is done by checking your blood sugar (glucose) after you have not eaten for a while (fasting). You may have this done every 1-3 years.  Mammogram. This may be done every 1-2 years. Talk with your health care provider about when you should start having regular mammograms. This may depend on whether you have a family history of breast cancer.  BRCA-related cancer screening. This may be done if you have a family history of breast, ovarian, tubal, or peritoneal cancers.  Pelvic exam and Pap test. This may be done every 3 years starting at age 84. Starting at age 69, this may be done every 5 years if you have a Pap test in combination with an HPV test. Other tests  Sexually transmitted disease (STD) testing.  Bone density scan. This is done to screen for osteoporosis. You may have this scan if you are at high risk for osteoporosis. Follow these instructions at home: Eating and drinking  Eat a diet that includes fresh fruits and vegetables, whole grains, lean protein, and low-fat dairy.  Take vitamin and mineral supplements as recommended by your health care provider.  Do not drink alcohol  if: ? Your health care provider tells you not to drink. ? You are pregnant, may be pregnant, or are planning to become pregnant.  If you drink alcohol: ? Limit how much you have to 0-1 drink a day. ? Be aware of how much alcohol is in your drink. In the [place] one drink equals one 12 oz bottle of beer (355 mL), one 5 oz glass of wine (148 mL), or one 1 oz glass of hard liquor (44 mL). Lifestyle  Take daily care of your teeth and gums.  Stay active. Exercise for at least 30 minutes on 5 or more days each week.  Do not use any products that contain nicotine or tobacco, such as cigarettes, e-cigarettes, and chewing tobacco. If you need help quitting, ask your health care provider.  If you are sexually active, practice safe sex. Use a condom or other form of birth control (contraception) in order to prevent pregnancy and STIs (sexually transmitted infections).  If told by your health care provider, take low-dose aspirin daily starting at age 29. What's next?  Visit your health care provider once a year for a well check visit.  Ask your health care provider how often you should have your eyes and teeth checked.  Stay up to date on all vaccines. This information is not intended to replace advice given to you by your health care provider. Make sure you discuss any questions you have with your health care provider. Document Revised: 09/01/2017 Document Reviewed: 09/01/2017 Elsevier Patient Education  2020 [employer].

## 2019-04-03 NOTE — Progress Notes (Addendum)
Established Patient Office Visit  Subjective:  Patient ID: Julie Hardy, female    DOB: 1970/11/19  Age: 49 y.o. MRN: [id number]  CC:  Chief Complaint  Patient presents with  . Follow-up    follow up on medications issues with possible menopause issues.   . Menopause    HPI Julie Hardy presents for follow-up on her depression and health maintenance.  Continues to do okay on 10 mg of Prozac.  Does not always sleep as well as she would like to.  Admits that she is a light sleeper.  Is up when the dog gets up when she hears her husband snore.  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  They have been back at school, "for normal", since January.  Most kids are in the classroom.  She has received her Covid vaccine.  She is in perimenopause with significant hot flashes.  Saw GYN this past December who suggested OTCs for her symptoms.  Patient has the Mirena for her birth control but her device is in its final years of usefulness.  Current thinking is to possibly change to HRT in a few years.  She has no symptoms consistent with bladder irritability.  Blood pressure normally is low.  She is walking 4 to 5 miles daily.  She has regular dental care.  Nonfasting today.  Past Medical History:  Diagnosis Date  . Migraine     History reviewed. No pertinent surgical history.  Family History  Problem Relation Age of Onset  . Cancer Mother   . Hearing loss Mother   . Alcohol abuse Father   . Arthritis Father   . Hearing loss Father   . Heart disease Father   . Hyperlipidemia Father     Social History   Socioeconomic History  . Marital status: Married    Spouse name: Not on file  . Number of children: Not on file  . Years of education: Not on file  . Highest education level: Not on file  Occupational History  . Not on file  Tobacco Use  . Smoking status: Never Smoker  . Smokeless tobacco: Never Used  Substance and Sexual Activity  .  Alcohol use: Yes    Alcohol/week: 7.0 standard drinks    Types: 7 Glasses of wine per week    Comment: social  . Drug use: No  . Sexual activity: Yes    Partners: Male  Other Topics Concern  . Not on file  Social History Narrative  . Not on file   Social Determinants of Health   Financial Resource Strain:   . Difficulty of Paying Living Expenses:   Food Insecurity:   . Worried About Charity fundraiser in the Last Year:   . Arboriculturist in the Last Year:   Transportation Needs:   . Film/video editor (Medical):   Marland Kitchen Lack of Transportation (Non-Medical):   Physical Activity:   . Days of Exercise per Week:   . Minutes of Exercise per Session:   Stress:   . Feeling of Stress :   Social Connections:   . Frequency of Communication with Friends and Family:   . Frequency of Social Gatherings with Friends and Family:   . Attends Religious Services:   . Active Member of Clubs or Organizations:   . Attends Archivist Meetings:   Marland Kitchen Marital Status:   Intimate Partner Violence:   . Fear of Current  or Ex-Partner:   . Emotionally Abused:   Marland Kitchen Physically Abused:   . Sexually Abused:     Outpatient Medications Prior to Visit  Medication Sig Dispense Refill  . Artificial Tear Ointment (DRY EYES OP) Apply 1 drop to eye as needed.    Marland Kitchen ibuprofen (ADVIL) 200 MG tablet Take 400 mg by mouth at bedtime.    Marland Kitchen levonorgestrel (MIRENA, 52 MG,) 20 MCG/24HR IUD Mirena 20 mcg/24 hours (6 yrs) 52 mg intrauterine device  Take 1 device by intrauterine route.    . loratadine (CLARITIN) 10 MG tablet Take 10 mg by mouth at bedtime.    Marland Kitchen FLUoxetine (PROZAC) 10 MG capsule TAKE 1 CAPSULE(10 MG) BY MOUTH TWICE DAILY 30 capsule 0  . eszopiclone (LUNESTA) 2 MG TABS tablet TAKE 1 TABLET BY MOUTH EVERY NIGHT AT BEDTIME AS NEEDED FOR SLEEP (Patient not taking: Reported on 04/03/2019) 30 tablet 0  . Vitamin D, Ergocalciferol, (DRISDOL) 50000 units CAPS capsule TAKE 1 CAPSULE BY MOUTH EVERY 7 DAYS  (Patient not taking: Reported on 04/03/2019) 30 capsule 0   No facility-administered medications prior to visit.    Allergies  Allergen Reactions  . Amoxicillin Hives  . Chocolate Flavor Other (See Comments)    headache    ROS Review of Systems  Constitutional: Negative.   HENT: Negative.   Respiratory: Negative.   Cardiovascular: Negative.   Gastrointestinal: Negative.   Genitourinary: Negative for difficulty urinating, frequency and urgency.  Musculoskeletal: Negative.   Allergic/Immunologic: Negative for immunocompromised state.  Neurological: Negative for tremors and speech difficulty.  Hematological: Negative.   Psychiatric/Behavioral: Positive for dysphoric mood. The patient is nervous/anxious.       Objective:    Physical Exam  Constitutional: She is oriented to person, place, and time. She appears well-developed and well-nourished. No distress.  HENT:  Head: Normocephalic and atraumatic.  Right Ear: External ear normal.  Left Ear: External ear normal.  Eyes: Conjunctivae are normal. Right eye exhibits no discharge. Left eye exhibits no discharge. No scleral icterus.  Neck: No JVD present. No tracheal deviation present. No thyromegaly present.  Cardiovascular: Normal rate, regular rhythm and normal heart sounds.  Pulmonary/Chest: Effort normal and breath sounds normal. No stridor.  Abdominal: Bowel sounds are normal.  Musculoskeletal:        General: No edema.  Lymphadenopathy:    She has no cervical adenopathy.  Neurological: She is alert and oriented to person, place, and time.  Skin: Skin is warm and dry. She is not diaphoretic.  Psychiatric: She has a normal mood and affect. Her behavior is normal.    BP 118/76   Pulse 87   Temp (!) 97.4 F (36.3 C) (Tympanic)   Ht 5\' 4"  (1.626 m)   Wt 164 lb 3.2 oz (74.5 kg)   SpO2 97%   BMI 28.18 kg/m  Wt Readings from Last 3 Encounters:  04/03/19 164 lb 3.2 oz (74.5 kg)  05/19/17 155 lb (70.3 kg)  05/19/17  155 lb (70.3 kg)     Health Maintenance Due  Topic Date Due  . HIV Screening  Never done  . TETANUS/TDAP  Never done  . PAP SMEAR-Modifier  Never done    There are no preventive care reminders to display for this patient.  Lab Results  Component Value Date   TSH 2.14 04/03/2019   Lab Results  Component Value Date   WBC 8.0 04/03/2019   HGB 13.6 04/03/2019   HCT 39.8 04/03/2019   MCV 94.7 04/03/2019  PLT 312.0 04/03/2019   Lab Results  Component Value Date   NA 137 04/03/2019   K 5.5 No hemolysis seen (H) 04/03/2019   CO2 27 04/03/2019   GLUCOSE 86 04/03/2019   BUN 17 04/03/2019   CREATININE 0.87 04/03/2019   BILITOT 0.4 04/03/2019   ALKPHOS 52 04/03/2019   AST 16 04/03/2019   ALT 18 04/03/2019   PROT 6.6 04/03/2019   ALBUMIN 4.2 04/03/2019   CALCIUM 9.2 04/03/2019   ANIONGAP 7 03/11/2017   GFR 69.85 04/03/2019   Lab Results  Component Value Date   CHOL 209 (H) 04/03/2019   Lab Results  Component Value Date   HDL 58.40 04/03/2019   Lab Results  Component Value Date   LDLCALC 122 (H) 04/03/2019   Lab Results  Component Value Date   TRIG 144.0 04/03/2019   Lab Results  Component Value Date   CHOLHDL 4 04/03/2019   No results found for: HGBA1C    Assessment & Plan:   Problem List Items Addressed This Visit      Other   Vitamin D deficiency   Relevant Orders   VITAMIN D 25 Hydroxy (Vit-D Deficiency, Fractures) (Completed)   Perimenopausal   Relevant Medications   FLUoxetine (PROZAC) 20 MG capsule   Other Relevant Orders   FSH (Completed)   Depression, major, single episode, in partial remission (HCC) - Primary   Relevant Medications   FLUoxetine (PROZAC) 20 MG capsule   Other Relevant Orders   CBC (Completed)   TSH (Completed)   Healthcare maintenance   Relevant Orders   CBC (Completed)   Comprehensive metabolic panel (Completed)   LDL cholesterol, direct (Completed)   Lipid panel (Completed)   Urinalysis, Routine w reflex  microscopic    Other Visit Diagnoses    Hyperkalemia       Relevant Orders   Potassium      Meds ordered this encounter  Medications  . FLUoxetine (PROZAC) 20 MG capsule    Sig: Take 1 capsule (20 mg total) by mouth daily.    Dispense:  90 capsule    Refill:  1    Follow-up: Return in about 3 months (around 07/04/2019), or if symptoms worsen or fail to improve.  Patient was given information on health maintenance and disease prevention as well as perimenopausal treatment.  For her depression and.  Menopausal symptoms that are predominantly had hot flashes we are going to increase the Prozac to 20 mg daily.  Mliss Sax, MD

## 2019-04-04 LAB — VITAMIN D 25 HYDROXY (VIT D DEFICIENCY, FRACTURES): VITD: 25.49 ng/mL — ABNORMAL LOW (ref 30.00–100.00)

## 2019-04-04 LAB — COMPREHENSIVE METABOLIC PANEL
ALT: 18 U/L (ref 0–35)
AST: 16 U/L (ref 0–37)
Albumin: 4.2 g/dL (ref 3.5–5.2)
Alkaline Phosphatase: 52 U/L (ref 39–117)
BUN: 17 mg/dL (ref 6–23)
CO2: 27 mEq/L (ref 19–32)
Calcium: 9.2 mg/dL (ref 8.4–10.5)
Chloride: 106 mEq/L (ref 96–112)
Creatinine, Ser: 0.87 mg/dL (ref 0.40–1.20)
GFR: 69.85 mL/min (ref >60.00)
Glucose, Bld: 86 mg/dL (ref 70–99)
Potassium: 5.5 mEq/L — ABNORMAL HIGH (ref 3.5–5.1)
Sodium: 137 mEq/L (ref 135–145)
Total Bilirubin: 0.4 mg/dL (ref 0.2–1.2)
Total Protein: 6.6 g/dL (ref 6.0–8.3)

## 2019-04-04 LAB — LDL CHOLESTEROL, DIRECT: Direct LDL: 131 mg/dL

## 2019-04-04 LAB — CBC
HCT: 39.8 % (ref 36.0–46.0)
Hemoglobin: 13.6 g/dL (ref 12.0–15.0)
MCHC: 34.2 g/dL (ref 30.0–36.0)
MCV: 94.7 fl (ref 78.0–100.0)
Platelets: 312 10*3/uL (ref 150.0–400.0)
RBC: 4.2 Mil/uL (ref 3.87–5.11)
RDW: 12.7 % (ref 11.5–15.5)
WBC: 8 10*3/uL (ref 4.0–10.5)

## 2019-04-04 LAB — LIPID PANEL
Cholesterol: 209 mg/dL — ABNORMAL HIGH (ref 0–200)
HDL: 58.4 mg/dL (ref >39.00)
LDL Cholesterol: 122 mg/dL — ABNORMAL HIGH (ref 0–99)
NonHDL: 150.48
Total CHOL/HDL Ratio: 4
Triglycerides: 144 mg/dL (ref 0.0–149.0)
VLDL: 28.8 mg/dL (ref 0.0–40.0)

## 2019-04-04 LAB — FOLLICLE STIMULATING HORMONE: FSH: 3.3 m[IU]/mL

## 2019-04-04 LAB — TSH: TSH: 2.14 u[IU]/mL (ref 0.35–4.50)

## 2019-04-05 NOTE — Addendum Note (Signed)
Addended by: Andrez Grime on: 04/05/2019 09:17 AM   Modules accepted: Orders

## 2019-04-09 ENCOUNTER — Other Ambulatory Visit (INDEPENDENT_AMBULATORY_CARE_PROVIDER_SITE_OTHER): Payer: BC Managed Care – PPO

## 2019-04-09 ENCOUNTER — Other Ambulatory Visit: Payer: Self-pay

## 2019-04-09 ENCOUNTER — Encounter: Payer: Self-pay | Admitting: Family Medicine

## 2019-04-09 DIAGNOSIS — E559 Vitamin D deficiency, unspecified: Secondary | ICD-10-CM

## 2019-04-09 DIAGNOSIS — E875 Hyperkalemia: Secondary | ICD-10-CM | POA: Diagnosis not present

## 2019-04-09 LAB — POTASSIUM: Potassium: 4.1 mEq/L (ref 3.5–5.1)

## 2019-04-09 MED ORDER — VITAMIN D (ERGOCALCIFEROL) 1.25 MG (50000 UNIT) PO CAPS: ORAL_CAPSULE | ORAL | 0 refills | Status: DC

## 2019-05-08 ENCOUNTER — Other Ambulatory Visit: Payer: Self-pay

## 2019-05-08 ENCOUNTER — Emergency Department (HOSPITAL_BASED_OUTPATIENT_CLINIC_OR_DEPARTMENT_OTHER)
Admission: EM | Admit: 2019-05-08 | Discharge: 2019-05-08 | Disposition: A | Discharge status: Other Acute Inpatient Hospital | Payer: BC Managed Care – PPO | Attending: Emergency Medicine | Admitting: Emergency Medicine

## 2019-05-08 ENCOUNTER — Encounter: Payer: Self-pay | Admitting: Family Medicine

## 2019-05-08 ENCOUNTER — Telehealth (INDEPENDENT_AMBULATORY_CARE_PROVIDER_SITE_OTHER): Payer: BC Managed Care – PPO | Admitting: Family Medicine

## 2019-05-08 ENCOUNTER — Encounter (HOSPITAL_BASED_OUTPATIENT_CLINIC_OR_DEPARTMENT_OTHER): Payer: Self-pay | Admitting: Emergency Medicine

## 2019-05-08 ENCOUNTER — Emergency Department (HOSPITAL_COMMUNITY): Payer: BC Managed Care – PPO

## 2019-05-08 ENCOUNTER — Emergency Department (HOSPITAL_BASED_OUTPATIENT_CLINIC_OR_DEPARTMENT_OTHER): Payer: BC Managed Care – PPO

## 2019-05-08 ENCOUNTER — Telehealth: Payer: BC Managed Care – PPO | Admitting: Family Medicine

## 2019-05-08 ENCOUNTER — Emergency Department (HOSPITAL_COMMUNITY)
Admission: EM | Admit: 2019-05-08 | Discharge: 2019-05-08 | Disposition: A | Discharge status: Home / Self Care | Payer: BC Managed Care – PPO | Source: Home / Self Care | Attending: Emergency Medicine | Admitting: Emergency Medicine

## 2019-05-08 VITALS — Temp 97.9°F | Ht 64.0 in | Wt 159.0 lb

## 2019-05-08 DIAGNOSIS — Z20822 Contact with and (suspected) exposure to covid-19: Secondary | ICD-10-CM | POA: Diagnosis not present

## 2019-05-08 DIAGNOSIS — G43109 Migraine with aura, not intractable, without status migrainosus: Secondary | ICD-10-CM

## 2019-05-08 DIAGNOSIS — M542 Cervicalgia: Secondary | ICD-10-CM | POA: Insufficient documentation

## 2019-05-08 DIAGNOSIS — R202 Paresthesia of skin: Secondary | ICD-10-CM | POA: Insufficient documentation

## 2019-05-08 DIAGNOSIS — Z8673 Personal history of transient ischemic attack (TIA), and cerebral infarction without residual deficits: Secondary | ICD-10-CM | POA: Diagnosis not present

## 2019-05-08 DIAGNOSIS — Z8616 Personal history of COVID-19: Secondary | ICD-10-CM | POA: Insufficient documentation

## 2019-05-08 DIAGNOSIS — Z79899 Other long term (current) drug therapy: Secondary | ICD-10-CM | POA: Diagnosis not present

## 2019-05-08 DIAGNOSIS — R519 Headache, unspecified: Secondary | ICD-10-CM | POA: Diagnosis not present

## 2019-05-08 DIAGNOSIS — R200 Anesthesia of skin: Secondary | ICD-10-CM

## 2019-05-08 DIAGNOSIS — I639 Cerebral infarction, unspecified: Secondary | ICD-10-CM

## 2019-05-08 LAB — COMPREHENSIVE METABOLIC PANEL
ALT: 19 U/L (ref 0–44)
AST: 17 U/L (ref 15–41)
Albumin: 4.1 g/dL (ref 3.5–5.0)
Alkaline Phosphatase: 55 U/L (ref 38–126)
Anion gap: 9 (ref 5–15)
BUN: 15 mg/dL (ref 6–20)
CO2: 21 mmol/L — ABNORMAL LOW (ref 22–32)
Calcium: 9.1 mg/dL (ref 8.9–10.3)
Chloride: 106 mmol/L (ref 98–111)
Creatinine, Ser: 0.58 mg/dL (ref 0.44–1.00)
GFR calc Af Amer: >60 mL/min (ref >60)
GFR calc non Af Amer: >60 mL/min (ref >60)
Glucose, Bld: 91 mg/dL (ref 70–99)
Potassium: 4.1 mmol/L (ref 3.5–5.1)
Sodium: 136 mmol/L (ref 135–145)
Total Bilirubin: 0.8 mg/dL (ref 0.3–1.2)
Total Protein: 7 g/dL (ref 6.5–8.1)

## 2019-05-08 LAB — CBC
HCT: 40.4 % (ref 36.0–46.0)
Hemoglobin: 14.3 g/dL (ref 12.0–15.0)
MCH: 32.4 pg (ref 26.0–34.0)
MCHC: 35.4 g/dL (ref 30.0–36.0)
MCV: 91.4 fL (ref 80.0–100.0)
Platelets: 291 10*3/uL (ref 150–400)
RBC: 4.42 MIL/uL (ref 3.87–5.11)
RDW: 12.1 % (ref 11.5–15.5)
WBC: 8.6 10*3/uL (ref 4.0–10.5)
nRBC: 0 % (ref 0.0–0.2)

## 2019-05-08 LAB — DIFFERENTIAL
Abs Immature Granulocytes: 0.11 10*3/uL — ABNORMAL HIGH (ref 0.00–0.07)
Basophils Absolute: 0 10*3/uL (ref 0.0–0.1)
Basophils Relative: 0 %
Eosinophils Absolute: 0.2 10*3/uL (ref 0.0–0.5)
Eosinophils Relative: 2 %
Immature Granulocytes: 1 %
Lymphocytes Relative: 24 %
Lymphs Abs: 2.1 10*3/uL (ref 0.7–4.0)
Monocytes Absolute: 0.6 10*3/uL (ref 0.1–1.0)
Monocytes Relative: 7 %
Neutro Abs: 5.6 10*3/uL (ref 1.7–7.7)
Neutrophils Relative %: 66 %

## 2019-05-08 LAB — RESPIRATORY PANEL BY RT PCR (FLU A&B, COVID)
Influenza A by PCR: NEGATIVE
Influenza B by PCR: NEGATIVE
SARS Coronavirus 2 by RT PCR: NEGATIVE

## 2019-05-08 LAB — PROTIME-INR
INR: 1 (ref 0.8–1.2)
Prothrombin Time: 12.6 seconds (ref 11.4–15.2)

## 2019-05-08 LAB — PREGNANCY, URINE: Preg Test, Ur: NEGATIVE

## 2019-05-08 LAB — APTT: aPTT: 30 seconds (ref 24–36)

## 2019-05-08 LAB — CBG MONITORING, ED: Glucose-Capillary: 92 mg/dL (ref 70–99)

## 2019-05-08 MED ORDER — BUTALBITAL-APAP-CAFFEINE 50-325-40 MG PO TABS: 1.0000 | ORAL_TABLET | Freq: Four times a day (QID) | ORAL | 0 refills | Status: AC | PRN

## 2019-05-08 MED ORDER — PROCHLORPERAZINE EDISYLATE 10 MG/2ML IJ SOLN
10.0000 mg | Freq: Once | INTRAMUSCULAR | Status: AC
  Administered 2019-05-08: 21:00:00 10 mg via INTRAVENOUS
  Filled 2019-05-08: qty 2

## 2019-05-08 MED ORDER — SODIUM CHLORIDE 0.9 % IV BOLUS
1000.0000 mL | Freq: Once | INTRAVENOUS | Status: AC
  Administered 2019-05-08: 1000 mL via INTRAVENOUS

## 2019-05-08 MED ORDER — DIPHENHYDRAMINE HCL 50 MG/ML IJ SOLN
12.5000 mg | Freq: Once | INTRAMUSCULAR | Status: AC
  Administered 2019-05-08: 21:00:00 12.5 mg via INTRAVENOUS
  Filled 2019-05-08: qty 1

## 2019-05-08 MED ORDER — SODIUM CHLORIDE 0.9% FLUSH
3.0000 mL | Freq: Once | INTRAVENOUS | Status: DC
  Filled 2019-05-08: qty 3

## 2019-05-08 MED ORDER — IOHEXOL 350 MG/ML SOLN
100.0000 mL | Freq: Once | INTRAVENOUS | Status: AC | PRN
  Administered 2019-05-08: 20:00:00 100 mL via INTRAVENOUS

## 2019-05-08 NOTE — ED Provider Notes (Signed)
Care assumed from [place], [place] as a transfer from [facility]. See note for full HPI.  In summation, 49 year old female appears otherwise well presents for evaluation of numbness and weakness which began a week ago Monday, 8 days PTA.  Feels like she has heaviness to her left arm.  Also had a severe migraine with left-sided facial pain last Friday.  Awoke this morning with similar headache.  She denies sudden onset thunderclap headache.  She also had a rash 7 days ago which she was seen by urgent care and diagnosed with shingles.  Apparently was across her left shoulder and arm as well abdomen.  Intermittent dizziness.  Denies prior history of complicated migraines.  Did have CVA approximately 2 years ago denies any current anticoagulation.  CT head personally reviewed from [facility] which did not show any significant findings.  Had labs at that time which were also personally reviewed.  Patient sent here for further imaging. Physical Exam  BP (!) 160/100 (BP Location: Right Arm)   Pulse 88   Temp 98.9 F (37.2 C) (Oral)   Resp 16   Ht 5\' 4"  (1.626 m)   Wt 72.1 kg   SpO2 98%   BMI 27.29 kg/m   Physical Exam Vitals and nursing note reviewed.  Constitutional:      General: She is not in acute distress.    Appearance: She is well-developed. She is not ill-appearing, toxic-appearing or diaphoretic.  HENT:     Head: Normocephalic and atraumatic.     Nose: Nose normal.     Mouth/Throat:     Mouth: Mucous membranes are moist.     Pharynx: Oropharynx is clear.  Eyes:     Pupils: Pupils are equal, round, and reactive to light.  Neck:     Trachea: Trachea and phonation normal.      Comments: Tenderness along her left trapezius.  No overlying skin changes.  No carotid bruits. Cardiovascular:     Rate and Rhythm: Normal rate.  Pulmonary:     Effort: No respiratory distress.  Abdominal:     General: There is no distension.  Musculoskeletal:        General: Normal  range of motion.     Cervical back: Normal range of motion and neck supple. No rigidity.  Lymphadenopathy:     Cervical: No cervical adenopathy.  Skin:    General: Skin is warm and dry.  Neurological:     Mental Status: She is alert.     Cranial Nerves: Cranial nerves are intact.     Sensory: Sensation is intact.     Gait: Gait is intact.     Comments: Cranial nerves II through XII grossly intact. 4/5 strength to left upper extremity and left lower extremity.  5/5 strength to right upper and lower extremity.  Negative Romberg, heel-to-shin, finger-to-nose.  Gait without ataxia.  Intact sensation to bilateral upper extremities without difficulty.      ED Course/Procedures     Procedures Labs Reviewed - No data to display CT Angio Head W or Wo Contrast  Result Date: 05/08/2019 CLINICAL DATA:  Headache and paresthesia. Left-sided weakness. Numbness left face and arm. EXAM: CT ANGIOGRAPHY HEAD AND NECK TECHNIQUE: Multidetector CT imaging of the head and neck was performed using the standard protocol during bolus administration of intravenous contrast. Multiplanar CT image reconstructions and MIPs were obtained to evaluate the vascular anatomy. Carotid stenosis measurements (when applicable) are obtained utilizing NASCET criteria, using the distal  internal carotid diameter as the denominator. CONTRAST:  OMNIPAQUE IOHEXOL 350 MG/ML SOLN COMPARISON:  MRI head and CT head 05/08/2019 FINDINGS: CTA NECK FINDINGS Aortic arch: Standard branching. Imaged portion shows no evidence of aneurysm or dissection. No significant stenosis of the major arch vessel origins. Right carotid system: Normal right carotid system. Negative for stenosis or dissection. No significant atherosclerotic disease Left carotid system: Normal left carotid system. Negative for stenosis dissection or atherosclerotic disease Vertebral arteries: Normal vertebral arteries bilaterally. Skeleton: No acute skeletal abnormality. Other  neck: Negative Upper chest: Negative Review of the MIP images confirms the above findings CTA HEAD FINDINGS Anterior circulation: Cavernous carotid widely patent bilaterally. Anterior and middle cerebral arteries normal bilaterally. Posterior circulation: Both vertebral arteries patent to the basilar. PICA patent bilaterally. Basilar widely patent. AICA, superior cerebellar, posterior cerebral arteries patent bilaterally without stenosis. Venous sinuses: Negative Anatomic variants: None Review of the MIP images confirms the above findings IMPRESSION: Normal CTA head neck. Electronically Signed   By: Marlan Palau M.D.   On: 05/08/2019 20:28   CT HEAD WO CONTRAST  Result Date: 05/08/2019 CLINICAL DATA:  Intermittent left face and arm numbness, recent migraine headache EXAM: CT HEAD WITHOUT CONTRAST TECHNIQUE: Contiguous axial images were obtained from the base of the skull through the vertex without intravenous contrast. COMPARISON:  12/09/2005 FINDINGS: Brain: No acute infarct or hemorrhage. Lateral ventricles and midline structures are unremarkable. No acute extra-axial fluid collections. No mass effect. Vascular: No hyperdense vessel or unexpected calcification. Skull: Normal. Negative for fracture or focal lesion. Sinuses/Orbits: No acute finding. Other: None IMPRESSION: 1. Stable head CT, no acute intracranial process. Electronically Signed   By: Sharlet Salina M.D.   On: 05/08/2019 14:59   CT Angio Neck W and/or Wo Contrast  Result Date: 05/08/2019 CLINICAL DATA:  Headache and paresthesia. Left-sided weakness. Numbness left face and arm. EXAM: CT ANGIOGRAPHY HEAD AND NECK TECHNIQUE: Multidetector CT imaging of the head and neck was performed using the standard protocol during bolus administration of intravenous contrast. Multiplanar CT image reconstructions and MIPs were obtained to evaluate the vascular anatomy. Carotid stenosis measurements (when applicable) are obtained utilizing NASCET criteria,  using the distal internal carotid diameter as the denominator. CONTRAST:  OMNIPAQUE IOHEXOL 350 MG/ML SOLN COMPARISON:  MRI head and CT head 05/08/2019 FINDINGS: CTA NECK FINDINGS Aortic arch: Standard branching. Imaged portion shows no evidence of aneurysm or dissection. No significant stenosis of the major arch vessel origins. Right carotid system: Normal right carotid system. Negative for stenosis or dissection. No significant atherosclerotic disease Left carotid system: Normal left carotid system. Negative for stenosis dissection or atherosclerotic disease Vertebral arteries: Normal vertebral arteries bilaterally. Skeleton: No acute skeletal abnormality. Other neck: Negative Upper chest: Negative Review of the MIP images confirms the above findings CTA HEAD FINDINGS Anterior circulation: Cavernous carotid widely patent bilaterally. Anterior and middle cerebral arteries normal bilaterally. Posterior circulation: Both vertebral arteries patent to the basilar. PICA patent bilaterally. Basilar widely patent. AICA, superior cerebellar, posterior cerebral arteries patent bilaterally without stenosis. Venous sinuses: Negative Anatomic variants: None Review of the MIP images confirms the above findings IMPRESSION: Normal CTA head neck. Electronically Signed   By: Marlan Palau M.D.   On: 05/08/2019 20:28   MR BRAIN WO CONTRAST  Result Date: 05/08/2019 CLINICAL DATA:  Left-sided weakness.  History migraine headache. EXAM: MRI HEAD WITHOUT CONTRAST TECHNIQUE: Multiplanar, multiecho pulse sequences of the brain and surrounding structures were obtained without intravenous contrast. COMPARISON:  CT head 05/08/2019  FINDINGS: Brain: Negative for acute infarct. Few small deep white matter hyperintensities bilaterally. Brainstem and cerebellum normal. Negative for hemorrhage or mass. Vascular: Normal arterial flow voids Skull and upper cervical spine: No focal skeletal abnormality. Sinuses/Orbits: Mucosal edema  paranasal sinuses.  Normal orbit Other: None IMPRESSION: No acute abnormality. Several small white matter hyperintensities most consistent with chronic microvascular ischemia or complex migraine headaches. Electronically Signed   By: Franchot Gallo M.D.   On: 05/08/2019 19:41    MDM  49 year old female presents for evaluation of left-sided weakness and paresthesias.  Started approximately 8 days ago.  Has also had "migraine headache."  Has had history of migraines her denies prior history of complicated migraine.   My evaluation patient also complaining of left-sided neck pain.  She initially thought this was due to "old pillows."  No sudden onset thunderclap headache.  No facial droop.  Cranial nerves II through XII grossly intact.  Denies any eye pain or vision changes.  Equal shoulder shrug bilaterally. 4/5 strength to left upper and lower extremity with 5/5 strength to right upper and lower extremity.  2 cm cluster of mildly erythematous vesicles at periumbilical line.  Low suspicion for meningitis, bleed, mass, encephalitis.  Plan additional imaging, treat headache and reassess. Labs from [place] as well as imaging obtained here personally reviewed and interpreted. MR brain WO acute changes. Possible complicated migraine CTA head and neck WO acute changes,  CONSULT with Dr. Rory Percy with Neurology, states likely complicated migraine. Can Dc home with outpatient Neuro follow up.  Reassessed after Compazine and Benadryl.  Feels improved.  Requesting DC home.  We will have her follow-up outpatient with neurology.   The patient has been appropriately medically screened and/or stabilized in the ED. I have low suspicion for any other emergent medical condition which would require further screening, evaluation or treatment in the ED or require inpatient management.  Patient is hemodynamically stable and in no acute distress.  Patient able to ambulate in department prior to ED.  Evaluation  does not show acute pathology that would require ongoing or additional emergent interventions while in the emergency department or further inpatient treatment.  I have discussed the diagnosis with the patient and answered all questions.  Pain is been managed while in the emergency department and patient has no further complaints prior to discharge.  Patient is comfortable with plan discussed in room and is stable for discharge at this time.  I have discussed strict return precautions for returning to the emergency department.  Patient was encouraged to follow-up with PCP/specialist refer to at discharge.      Zerick Prevette A, PA-C 05/08/19 [id number]    Little, Wenda Overland, MD 05/08/19 [id number]

## 2019-05-08 NOTE — Discharge Instructions (Signed)
Follow-up with neurology 

## 2019-05-08 NOTE — ED Notes (Signed)
Patient transported to MRI 

## 2019-05-08 NOTE — ED Triage Notes (Signed)
[facility] bib [facility] for [facility]. Pt has been having intermittent L numbness and weakness in arm and face for past 2 weeks. Since Friday it has been constant. Pt a/o x4. Hypertensive on arrival 162/103, when she usually is 100s systolic per pt. Pt also reports a h/a

## 2019-05-08 NOTE — Progress Notes (Signed)
Established Patient Office Visit  Subjective:  Patient ID: Julie Hardy, female    DOB: 1970/02/27  Age: 49 y.o. MRN: [id number]  CC:  Chief Complaint  Patient presents with  . Facial Swelling    Weakness and numbness in lt side of face, x10days    HPI [name] presents for evaluation of a 7-day history of a weakness and heaviness on the left side of her body.  Symptoms of been associated with to recent severe "migraine headaches".  She has had no difficulties with swallowing.  She feels numb in the left side of her face.  [facility] with Valtrex and Tylenol.  Rash involves her left shoulder and arm.  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  Past Medical History:  Diagnosis Date  . Migraine     History reviewed. No pertinent surgical history.  Family History  Problem Relation Age of Onset  . Cancer Mother   . Hearing loss Mother   . Alcohol abuse Father   . Arthritis Father   . Hearing loss Father   . Heart disease Father   . Hyperlipidemia Father     Social History   Socioeconomic History  . Marital status: Married    Spouse name: Not on file  . Number of children: Not on file  . Years of education: Not on file  . Highest education level: Not on file  Occupational History  . Not on file  Tobacco Use  . Smoking status: Never Smoker  . Smokeless tobacco: Never Used  Substance and Sexual Activity  . Alcohol use: Yes    Alcohol/week: 7.0 standard drinks    Types: 7 Glasses of wine per week    Comment: social  . Drug use: No  . Sexual activity: Yes    Partners: Male  Other Topics Concern  . Not on file  Social History Narrative  . Not on file   Social Determinants of Health   Financial Resource Strain:   . Difficulty of Paying Living Expenses:   Food Insecurity:   . Worried About Charity fundraiser in the Last Year:   . Arboriculturist in the  Last Year:   Transportation Needs:   . Film/video editor (Medical):   Marland Kitchen Lack of Transportation (Non-Medical):   Physical Activity:   . Days of Exercise per Week:   . Minutes of Exercise per Session:   Stress:   . Feeling of Stress :   Social Connections:   . Frequency of Communication with Friends and Family:   . Frequency of Social Gatherings with Friends and Family:   . Attends Religious Services:   . Active Member of Clubs or Organizations:   . Attends Archivist Meetings:   Marland Kitchen Marital Status:   Intimate Partner Violence:   . Fear of Current or Ex-Partner:   . Emotionally Abused:   Marland Kitchen Physically Abused:   . Sexually Abused:     Outpatient Medications Prior to Visit  Medication Sig Dispense Refill  . Artificial Tear Ointment (DRY EYES OP) Apply 1 drop to eye as needed.    . eszopiclone (LUNESTA) 2 MG TABS tablet TAKE 1 TABLET BY MOUTH EVERY NIGHT AT BEDTIME AS NEEDED FOR SLEEP 30 tablet 0  . FLUoxetine (PROZAC) 20 MG capsule Take 1 capsule (20 mg total) by mouth daily. 90 capsule 1  .  ibuprofen (ADVIL) 200 MG tablet Take 400 mg by mouth at bedtime.    Marland Kitchen levonorgestrel (MIRENA, 52 MG,) 20 MCG/24HR IUD Mirena 20 mcg/24 hours (6 yrs) 52 mg intrauterine device  Take 1 device by intrauterine route.    . loratadine (CLARITIN) 10 MG tablet Take 10 mg by mouth at bedtime.    . Vitamin D, Ergocalciferol, (DRISDOL) 1.25 MG (50000 UNIT) CAPS capsule TAKE 1 CAPSULE BY MOUTH EVERY 7 DAYS 12 capsule 0  . valACYclovir (VALTREX) 1000 MG tablet Take 1,000 mg by mouth 3 (three) times daily.     No facility-administered medications prior to visit.    Allergies  Allergen Reactions  . Amoxicillin Hives  . Chocolate Flavor Other (See Comments)    headache    ROS Review of Systems  Constitutional: Negative for diaphoresis, fatigue, fever and unexpected weight change.  HENT: Negative.   Eyes: Negative for photophobia and visual disturbance.  Respiratory: Negative.     Cardiovascular: Negative.   Gastrointestinal: Negative.   Musculoskeletal: Negative for gait problem and myalgias.  Neurological: Positive for numbness and headaches. Negative for facial asymmetry, speech difficulty, weakness and light-headedness.  Hematological: Negative.       Objective:    Physical Exam  Constitutional: She is oriented to person, place, and time. She appears well-developed and well-nourished. No distress.  HENT:  Head: Normocephalic and atraumatic.  Right Ear: External ear normal.  Left Ear: External ear normal.  Eyes: Conjunctivae are normal. Right eye exhibits no discharge. Left eye exhibits no discharge. No scleral icterus.  Neck: No tracheal deviation present.  Pulmonary/Chest: Effort normal. No stridor.  Neurological: She is alert and oriented to person, place, and time.  Skin: She is not diaphoretic.  Psychiatric: She has a normal mood and affect. Her behavior is normal.    Temp 97.9 F (36.6 C) (Temporal)   Ht 5\' 4"  (1.626 m)   Wt 159 lb (72.1 kg)   BMI 27.29 kg/m  Wt Readings from Last 3 Encounters:  05/08/19 159 lb (72.1 kg)  04/03/19 164 lb 3.2 oz (74.5 kg)  05/19/17 155 lb (70.3 kg)     Health Maintenance Due  Topic Date Due  . HIV Screening  Never done  . TETANUS/TDAP  Never done  . PAP SMEAR-Modifier  Never done    There are no preventive care reminders to display for this patient.  Lab Results  Component Value Date   TSH 2.14 04/03/2019   Lab Results  Component Value Date   WBC 8.0 04/03/2019   HGB 13.6 04/03/2019   HCT 39.8 04/03/2019   MCV 94.7 04/03/2019   PLT 312.0 04/03/2019   Lab Results  Component Value Date   NA 137 04/03/2019   K 4.1 04/09/2019   CO2 27 04/03/2019   GLUCOSE 86 04/03/2019   BUN 17 04/03/2019   CREATININE 0.87 04/03/2019   BILITOT 0.4 04/03/2019   ALKPHOS 52 04/03/2019   AST 16 04/03/2019   ALT 18 04/03/2019   PROT 6.6 04/03/2019   ALBUMIN 4.2 04/03/2019   CALCIUM 9.2 04/03/2019    ANIONGAP 7 03/11/2017   GFR 69.85 04/03/2019   Lab Results  Component Value Date   CHOL 209 (H) 04/03/2019   Lab Results  Component Value Date   HDL 58.40 04/03/2019   Lab Results  Component Value Date   LDLCALC 122 (H) 04/03/2019   Lab Results  Component Value Date   TRIG 144.0 04/03/2019   Lab Results  Component Value Date  CHOLHDL 4 04/03/2019   No results found for: HGBA1C    Assessment & Plan:   Problem List Items Addressed This Visit      Cardiovascular and Mediastinum   Cerebrovascular accident (CVA) (HCC) - Primary      No orders of the defined types were placed in this encounter.   Follow-up: No follow-ups on file.  Bells Palsy?(could not her forehead well on video). Concerned about CVA with entire left sided weakness and headache.  Will go to ER now for evaluation for CVA.   Virtual Visit via Video Note  I connected with Desmond Lope on 05/08/19 at  1:00 PM EDT by a video enabled telemedicine application and verified that I am speaking with the correct person using two identifiers.  Location: Patient: work alone.  Provider:   I discussed the limitations of evaluation and management by telemedicine and the availability of in person appointments. The patient expressed understanding and agreed to proceed.  History of Present Illness:    Observations/Objective:   Assessment and Plan:   Follow Up Instructions:    I discussed the assessment and treatment plan with the patient. The patient was provided an opportunity to ask questions and all were answered. The patient agreed with the plan and demonstrated an understanding of the instructions.   The patient was advised to call back or seek an in-person evaluation if the symptoms worsen or if the condition fails to improve as anticipated.  I provided 20 minutes of non-face-to-face time during this encounter.   Mliss Sax, MD   Mliss Sax, MD

## 2019-05-08 NOTE — ED Triage Notes (Addendum)
Pt states she had a rash last week with some intermittent numbness to left face and arm. Pt states Friday she had a migraine, then started to have constant numbness to left face and left arm.  Grips equal, no facial drooping.  No weakness in strength noted unilaterally.  Pt states her left arm feels heavy.  Pt does have decreased sensation to right face and arm.

## 2019-05-08 NOTE — ED Notes (Signed)
Patient transported to CT 

## 2019-05-08 NOTE — ED Provider Notes (Signed)
[facility] EMERGENCY DEPARTMENT Provider Note   CSN: [id number] Arrival date & time: 05/08/19  1355     History Chief Complaint  Patient presents with  . Numbness    Julie Hardy is a 49 y.o. female.  HPI 49 year old female with a history of CVA, depression, migraine, COVID-19 presents to the ER for left-sided numbness and weakness to her left arm and leg which began last Monday.  Patient noticed increasing heaviness to her left arm and numbness while she was at work.  Patient states that she had a severe migraine headache with left-sided facial pain and watery eyes last Friday.  She also developed a rash approximately 7 days ago which has now mostly resolved.  She went to urgent care on 05/07/2019 for concern for shingles.  Per chart review, patient reported that the rash was across her left shoulder and arm as well though this is not evident today.  She was diagnosed with shingles and given acyclovir.  She denies a rash on her face.  She states that the headache has waxed and waned with associated intermittent dizziness but the numbness and weakness has remained constant.  She denies any vision changes but states that her left eye was watery but no vision changes or redness.  She mentioned the left-sided numbness and weakness to urgent care who encouraged her to follow-up with her PCP.  Patient spoke with PCP via telemed this Julie and was told to come to the ER.  She denies any fevers, confusion, chills, nausea, vomiting, syncope, chest pain, back pain, shortness of breath.  She states she had a CVA approximately 2 years ago and is not on any anticoagulation.    Past Medical History:  Diagnosis Date  . Migraine     Patient Active Problem List   Diagnosis Date Noted  . Cerebrovascular accident (CVA) (HCC) 05/08/2019  . Perimenopausal 04/03/2019  . 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 Healthcare maintenance 04/03/2019  . Viral  disease exposure 08/02/2018  . Exposure to COVID-19 virus 08/02/2018  . Irritable bowel syndrome 05/19/2017  . Primary insomnia 03/17/2017  . Vitamin D deficiency 03/17/2017  . Snores 03/17/2017  . Stress 03/17/2017    No past surgical history on file.   OB History   No obstetric history on file.     Family History  Problem Relation Age of Onset  . Cancer Mother   . Hearing loss Mother   . Alcohol abuse Father   . Arthritis Father   . Hearing loss Father   . Heart disease Father   . Hyperlipidemia Father     Social History   Tobacco Use  . Smoking status: Never Smoker  . Smokeless tobacco: Never Used  Substance Use Topics  . Alcohol use: Yes    Alcohol/week: 7.0 standard drinks    Types: 7 Glasses of wine per week    Comment: social  . Drug use: No    Home Medications Prior to Admission medications   Medication Sig Start Date End Date Taking? Authorizing Provider  Artificial Tear Ointment (DRY EYES OP) Apply 1 drop to eye as needed.    [provider]  eszopiclone (LUNESTA) 2 MG TABS tablet TAKE 1 TABLET BY MOUTH EVERY NIGHT AT BEDTIME AS NEEDED FOR SLEEP 10/03/17   Mliss Sax, MD  FLUoxetine (PROZAC) 20 MG capsule Take 1 capsule (20 mg total) by mouth daily. 04/03/19   Mliss Sax, MD  ibuprofen (ADVIL)  200 MG tablet Take 400 mg by mouth at bedtime.    [provider]  levonorgestrel (MIRENA, 52 MG,) 20 MCG/24HR IUD Mirena 20 mcg/24 hours (6 yrs) 52 mg intrauterine device  Take 1 device by intrauterine route.    [provider]  loratadine (CLARITIN) 10 MG tablet Take 10 mg by mouth at bedtime.    [provider]  valACYclovir (VALTREX) 1000 MG tablet Take 1,000 mg by mouth 3 (three) times daily. 05/07/19   [provider]  Vitamin D, Ergocalciferol, (DRISDOL) 1.25 MG (50000 UNIT) CAPS capsule TAKE 1 CAPSULE BY MOUTH EVERY 7 DAYS 04/09/19   Mliss Sax, MD    Allergies    Amoxicillin and  Chocolate flavor  Review of Systems   Review of Systems  Constitutional: Negative for chills, fatigue and fever.  HENT: Negative for ear pain, sore throat and trouble swallowing.   Eyes: Negative for photophobia, pain, discharge, redness and visual disturbance.  Respiratory: Negative for cough and shortness of breath.   Cardiovascular: Negative for chest pain and palpitations.  Gastrointestinal: Negative for abdominal pain and vomiting.  Genitourinary: Negative for dysuria and hematuria.  Musculoskeletal: Negative for arthralgias, back pain, neck pain and neck stiffness.  Skin: Negative for color change and rash.  Neurological: Positive for dizziness, weakness, numbness and headaches. Negative for tremors, seizures, syncope, facial asymmetry, speech difficulty and light-headedness.  Psychiatric/Behavioral: Negative for confusion.  All other systems reviewed and are negative.   Physical Exam Updated Vital Signs BP (!) 147/98   Pulse 80 Comment: Simultaneous filing. User may not have seen previous data.  Temp 98 F (36.7 C) (Oral)   Resp 19 Comment: Simultaneous filing. User may not have seen previous data.  SpO2 98% Comment: Simultaneous filing. User may not have seen previous data.  Physical Exam Vitals and nursing note reviewed.  Constitutional:      General: She is not in acute distress.    Appearance: Normal appearance. She is well-developed. She is not ill-appearing or diaphoretic.  HENT:     Head: Normocephalic and atraumatic.     Nose: Nose normal.     Mouth/Throat:     Mouth: Mucous membranes are moist.     Pharynx: No oropharyngeal exudate or posterior oropharyngeal erythema.  Eyes:     Extraocular Movements: Extraocular movements intact.     Conjunctiva/sclera: Conjunctivae normal.     Pupils: Pupils are equal, round, and reactive to light.  Cardiovascular:     Rate and Rhythm: Normal rate and regular rhythm.     Heart sounds: No murmur.  Pulmonary:     Effort:  Pulmonary effort is normal. No respiratory distress.     Breath sounds: Normal breath sounds.  Abdominal:     Palpations: Abdomen is soft.     Tenderness: There is no abdominal tenderness.  Musculoskeletal:        General: No swelling, tenderness, deformity or signs of injury. Normal range of motion.     Cervical back: Normal range of motion and neck supple. No rigidity. Tenderness: Left paraspinal C-spine tenderness, tenderness over left trapezius.  No noticeable rashes, erythema, fluctuance, or evidence of cellulitis.     Right lower leg: No edema.     Left lower leg: No edema.     Comments: No midline C-spine, T-spine, L-spine tenderness.  Skin:    General: Skin is warm and dry.     Findings: Rash present. No abrasion, abscess or bruising.     Comments: 2  cmx 2cm cluster of mildly erythematous vesicles in the periumbilical midline area.  Nontender to palpation.  No noticeable drainage.  No evidence of dermatomal pattern.  No evidence of rashes on the left arm, face, shoulders.  Neurological:     Mental Status: She is alert and oriented to person, place, and time.     GCS: GCS eye subscore is 4. GCS verbal subscore is 5. GCS motor subscore is 6.     Cranial Nerves: No cranial nerve deficit.     Sensory: Sensory deficit present.     Motor: Weakness present.     Coordination: Romberg sign negative. Coordination normal. Finger-Nose-Finger Test and Heel to [facility] Test normal. Rapid alternating movements normal.     Deep Tendon Reflexes: Reflexes normal.     Reflex Scores:      Tricep reflexes are 2+ on the right side and 2+ on the left side.      Bicep reflexes are 2+ on the right side and 2+ on the left side.      Brachioradialis reflexes are 2+ on the right side and 2+ on the left side.      Patellar reflexes are 2+ on the right side and 2+ on the left side.      Achilles reflexes are 2+ on the right side and 2+ on the left side.    Comments: Mental Status:  Alert, thought content  appropriate, able to give a coherent history. Speech fluent without evidence of aphasia. Able to follow 2 step commands without difficulty.  Cranial Nerves:  II: Peripheral visual fields grossly normal, pupils equal, round, reactive to light III,IV, VI: ptosis not present, extra-ocular motions intact bilaterally  V,VII: smile symmetric, facial light touch sensation equal, no facial droop VIII: hearing grossly normal to voice  X: uvula elevates symmetrically  XI: bilateral shoulder shrug symmetric and strong XII: midline tongue extension with mild fasciculations motor:  Normal tone. 3/5 strength left lower extremity, 5/5 strength in right LE. 5/5 strength in BUE. 3/5 strength with  Left foot plantatar flexion and dorsiflexion. Right UE and LE strength and sensation normal. Sensory: Decreased sensation in lateral aspect of left arm and left leg. Cerebellar: normal finger-to-nose with bilateral upper extremities, Romberg sign absent Gait: Not assessed   Psychiatric:        Mood and Affect: Mood normal.        Behavior: Behavior normal.     ED Results / Procedures / Treatments   Labs (all labs ordered are listed, but only abnormal results are displayed) Labs Reviewed  DIFFERENTIAL - Abnormal; Notable for the following components:      Result Value   Abs Immature Granulocytes 0.11 (*)    All other components within normal limits  COMPREHENSIVE METABOLIC PANEL - Abnormal; Notable for the following components:   CO2 21 (*)    All other components within normal limits  RESPIRATORY PANEL BY RT PCR (FLU A&B, COVID)  PROTIME-INR  APTT  CBC  PREGNANCY, URINE  CBG MONITORING, ED    EKG EKG Interpretation  Date/Time:  Tuesday May 08 2019 14:26:45 EDT Ventricular Rate:  79 PR Interval:    QRS Duration: 71 QT Interval:  353 QTC Calculation: 405 R Axis:   16 Text Interpretation: Sinus rhythm LVH by voltage No significant change since prior 3/19 Confirmed by Meridee Score [phone]) on  05/08/2019 2:35:26 PM   Radiology CT HEAD WO CONTRAST  Result Date: 05/08/2019 CLINICAL DATA:  Intermittent left face and arm numbness,  recent migraine headache EXAM: CT HEAD WITHOUT CONTRAST TECHNIQUE: Contiguous axial images were obtained from the base of the skull through the vertex without intravenous contrast. COMPARISON:  12/09/2005 FINDINGS: Brain: No acute infarct or hemorrhage. Lateral ventricles and midline structures are unremarkable. No acute extra-axial fluid collections. No mass effect. Vascular: No hyperdense vessel or unexpected calcification. Skull: Normal. Negative for fracture or focal lesion. Sinuses/Orbits: No acute finding. Other: None IMPRESSION: 1. Stable head CT, no acute intracranial process. Electronically Signed   By: Randa Ngo M.D.   On: 05/08/2019 14:59    Procedures Procedures (including critical care time)  Medications Ordered in ED Medications - No data to display  ED Course  I have reviewed the triage vital signs and the nursing notes.  Pertinent labs & imaging results that were available during my care of the patient were reviewed by me and considered in my medical decision making (see chart for details).    MDM Rules/Calculators/A&P                     49 year old female with left-sided numbness and weakness x2 weeks. On presentation to ER, patient is well-appearing, alert and oriented x3, nontoxic-appearing, in no acute distress.  Physical exam positive for left-sided decreased sensation in UE and LE.  3/5 strength in left lower extremity.  Mild tongue fasciculation.  No other focal neuro deficits noted. Vitals overall reassuring, patient afebrile.  DDx includes CVA/stroke, herpes encephalitis, MS, cervicogenic causes, Guillan  Barre, [id number] complication, trigeminal neuralgia, CMP without significant electrolyte abnormalities, no renal dysfunction.  CBC without leukocytosis, normal hemoglobin.  APTT and INR normal.  CBG normal.  CT head normal,  however given patient's presentation and positive physical exam findings it is in her best interest to get a follow-up MRI.  No MRI available here at [facility].  I spoke with Dr. Sedonia Small who has agreed to do an ED to ED transfer for further evaluation and MRI.  He will also consult neuro.  The patient is overall well-appearing, no acute distress.  Stable for transfer at this time. Final Clinical Impression(s) / ED Diagnoses Final diagnoses:  Numbness    Rx / DC Orders ED Discharge Orders    None       Garald Balding, PA-C 05/08/19 1615    Gareth Morgan, MD 05/09/19 1415

## 2019-05-08 NOTE — ED Notes (Signed)
Last known normal is 4/30 at 21:00.

## 2019-05-11 ENCOUNTER — Telehealth: Payer: Self-pay | Admitting: Family Medicine

## 2019-05-11 MED ORDER — FAMCICLOVIR 500 MG PO TABS: 500.0000 mg | ORAL_TABLET | Freq: Three times a day (TID) | ORAL | 0 refills | Status: AC

## 2019-05-11 NOTE — Telephone Encounter (Signed)
Can switch to Famvir 500mg  1 tid for 7 days.

## 2019-05-11 NOTE — Telephone Encounter (Signed)
Patient is calling and stated that she was talking valtrex for shingles and she is experiencing headache and nausea. Patient wanted to know if this was normal while taking this medication and also wanted to know if she can stop the medication once the shingles has dried up. Pls advise. CB is [phone]

## 2019-05-11 NOTE — Telephone Encounter (Signed)
Pt aware and in agreement to Dwan Hemmelgarn/C Valtrex/Start Famvir 500mg  1tid x7d/sent to pharmacy/thx dmf

## 2019-05-17 ENCOUNTER — Ambulatory Visit: Payer: BC Managed Care – PPO | Admitting: Family Medicine

## 2019-06-30 ENCOUNTER — Other Ambulatory Visit: Payer: Self-pay | Admitting: Family Medicine

## 2019-06-30 DIAGNOSIS — E559 Vitamin D deficiency, unspecified: Secondary | ICD-10-CM

## 2019-09-25 ENCOUNTER — Other Ambulatory Visit: Payer: Self-pay | Admitting: Family Medicine

## 2019-09-25 DIAGNOSIS — E559 Vitamin D deficiency, unspecified: Secondary | ICD-10-CM

## 2019-10-01 ENCOUNTER — Other Ambulatory Visit: Payer: Self-pay | Admitting: Family Medicine

## 2019-10-01 DIAGNOSIS — N951 Menopausal and female climacteric states: Secondary | ICD-10-CM

## 2019-10-01 DIAGNOSIS — F324 Major depressive disorder, single episode, in partial remission: Secondary | ICD-10-CM

## 2019-10-12 ENCOUNTER — Other Ambulatory Visit: Payer: Self-pay | Admitting: Family Medicine

## 2019-10-12 DIAGNOSIS — F320 Major depressive disorder, single episode, mild: Secondary | ICD-10-CM

## 2020-01-15 ENCOUNTER — Other Ambulatory Visit: Payer: Self-pay | Admitting: Family Medicine

## 2020-01-15 DIAGNOSIS — E559 Vitamin D deficiency, unspecified: Secondary | ICD-10-CM

## 2020-05-20 ENCOUNTER — Encounter: Payer: Self-pay | Admitting: Family Medicine

## 2020-05-23 ENCOUNTER — Ambulatory Visit: Payer: BC Managed Care – PPO | Admitting: Family Medicine

## 2020-05-23 ENCOUNTER — Encounter: Payer: Self-pay | Admitting: Family Medicine

## 2020-05-23 ENCOUNTER — Other Ambulatory Visit: Payer: Self-pay

## 2020-05-23 VITALS — BP 110/70 | HR 71 | Temp 97.0°F | Ht 64.0 in | Wt 147.4 lb

## 2020-05-23 DIAGNOSIS — Z0000 Encounter for general adult medical examination without abnormal findings: Secondary | ICD-10-CM

## 2020-05-23 DIAGNOSIS — Z23 Encounter for immunization: Secondary | ICD-10-CM

## 2020-05-23 DIAGNOSIS — F324 Major depressive disorder, single episode, in partial remission: Secondary | ICD-10-CM

## 2020-05-23 DIAGNOSIS — E559 Vitamin D deficiency, unspecified: Secondary | ICD-10-CM

## 2020-05-23 LAB — URINALYSIS, ROUTINE W REFLEX MICROSCOPIC
Bilirubin Urine: NEGATIVE
Ketones, ur: NEGATIVE
Nitrite: NEGATIVE
Specific Gravity, Urine: <=1.005 — AB (ref 1.000–1.030)
Total Protein, Urine: NEGATIVE
Urine Glucose: NEGATIVE
Urobilinogen, UA: 0.2 (ref 0.0–1.0)
pH: 6 (ref 5.0–8.0)

## 2020-05-23 LAB — COMPREHENSIVE METABOLIC PANEL
ALT: 18 U/L (ref 0–35)
AST: 13 U/L (ref 0–37)
Albumin: 4.3 g/dL (ref 3.5–5.2)
Alkaline Phosphatase: 58 U/L (ref 39–117)
BUN: 16 mg/dL (ref 6–23)
CO2: 26 mEq/L (ref 19–32)
Calcium: 9.5 mg/dL (ref 8.4–10.5)
Chloride: 104 mEq/L (ref 96–112)
Creatinine, Ser: 0.71 mg/dL (ref 0.40–1.20)
GFR: 100.88 mL/min (ref >60.00)
Glucose, Bld: 76 mg/dL (ref 70–99)
Potassium: 4.5 mEq/L (ref 3.5–5.1)
Sodium: 138 mEq/L (ref 135–145)
Total Bilirubin: 0.6 mg/dL (ref 0.2–1.2)
Total Protein: 6.5 g/dL (ref 6.0–8.3)

## 2020-05-23 LAB — LIPID PANEL
Cholesterol: 169 mg/dL (ref 0–200)
HDL: 54.5 mg/dL (ref >39.00)
LDL Cholesterol: 97 mg/dL (ref 0–99)
NonHDL: 114.1
Total CHOL/HDL Ratio: 3
Triglycerides: 88 mg/dL (ref 0.0–149.0)
VLDL: 17.6 mg/dL (ref 0.0–40.0)

## 2020-05-23 LAB — CBC
HCT: 40.5 % (ref 36.0–46.0)
Hemoglobin: 13.6 g/dL (ref 12.0–15.0)
MCHC: 33.7 g/dL (ref 30.0–36.0)
MCV: 95.5 fl (ref 78.0–100.0)
Platelets: 273 10*3/uL (ref 150.0–400.0)
RBC: 4.24 Mil/uL (ref 3.87–5.11)
RDW: 13.3 % (ref 11.5–15.5)
WBC: 8.7 10*3/uL (ref 4.0–10.5)

## 2020-05-23 LAB — LDL CHOLESTEROL, DIRECT: Direct LDL: 107 mg/dL

## 2020-05-23 LAB — VITAMIN D 25 HYDROXY (VIT D DEFICIENCY, FRACTURES): VITD: 53.22 ng/mL (ref 30.00–100.00)

## 2020-05-23 MED ORDER — FLUOXETINE HCL 10 MG PO CAPS: 10.0000 mg | ORAL_CAPSULE | Freq: Every day | ORAL | 1 refills | Status: DC

## 2020-05-23 NOTE — Addendum Note (Signed)
Addended by: Jimmye Norman on: 05/23/2020 10:29 AM   Modules accepted: Orders

## 2020-05-23 NOTE — Patient Instructions (Signed)
Health Maintenance, Female Adopting a healthy lifestyle and getting preventive care are important in promoting health and wellness. Ask your health care provider about:  The right schedule for you to have regular tests and exams.  Things you can do on your own to prevent diseases and keep yourself healthy. What should I know about diet, weight, and exercise? Eat a healthy diet  Eat a diet that includes plenty of vegetables, fruits, low-fat dairy products, and lean protein.  Do not eat a lot of foods that are high in solid fats, added sugars, or sodium.   Maintain a healthy weight Body mass index (BMI) is used to identify weight problems. It estimates body fat based on height and weight. Your health care provider can help determine your BMI and help you achieve or maintain a healthy weight. Get regular exercise Get regular exercise. This is one of the most important things you can do for your health. Most adults should:  Exercise for at least 150 minutes each week. The exercise should increase your heart rate and make you sweat (moderate-intensity exercise).  Do strengthening exercises at least twice a week. This is in addition to the moderate-intensity exercise.  Spend less time sitting. Even light physical activity can be beneficial. Watch cholesterol and blood lipids Have your blood tested for lipids and cholesterol at 48 years of age, then have this test every 5 years. Have your cholesterol levels checked more often if:  Your lipid or cholesterol levels are high.  You are older than 48 years of age.  You are at high risk for heart disease. What should I know about cancer screening? Depending on your health history and family history, you may need to have cancer screening at various ages. This may include screening for:  Breast cancer.  Cervical cancer.  Colorectal cancer.  Skin cancer.  Lung cancer. What should I know about heart disease, diabetes, and high blood  pressure? Blood pressure and heart disease  High blood pressure causes heart disease and increases the risk of stroke. This is more likely to develop in people who have high blood pressure readings, are of African descent, or are overweight.  Have your blood pressure checked: ? Every 3-5 years if you are 59-59 years of age. ? Every year if you are 63 years old or older. Diabetes Have regular diabetes screenings. This checks your fasting blood sugar level. Have the screening done:  Once every three years after age 58 if you are at a normal weight and have a low risk for diabetes.  More often and at a younger age if you are overweight or have a high risk for diabetes. What should I know about preventing infection? Hepatitis B If you have a higher risk for hepatitis B, you should be screened for this virus. Talk with your health care provider to find out if you are at risk for hepatitis B infection. Hepatitis C Testing is recommended for:  Everyone born from [birth year] through 1963.  Anyone with known risk factors for hepatitis C. Sexually transmitted infections (STIs)  Get screened for STIs, including gonorrhea and chlamydia, if: ? You are sexually active and are younger than 48 years of age. ? You are older than 48 years of age and your health care provider tells you that you are at risk for this type of infection. ? Your sexual activity has changed since you were last screened, and you are at increased risk for chlamydia or gonorrhea. Ask your health care provider  if you are at risk.  Ask your health care provider about whether you are at high risk for HIV. Your health care provider may recommend a prescription medicine to help prevent HIV infection. If you choose to take medicine to prevent HIV, you should first get tested for HIV. You should then be tested every 3 months for as long as you are taking the medicine. Pregnancy  If you are about to stop having your period (premenopausal) and  you may become pregnant, seek counseling before you get pregnant.  Take 400 to 800 micrograms (mcg) of folic acid every day if you become pregnant.  Ask for birth control (contraception) if you want to prevent pregnancy. Osteoporosis and menopause Osteoporosis is a disease in which the bones lose minerals and strength with aging. This can result in bone fractures. If you are 65 years old or older, or if you are at risk for osteoporosis and fractures, ask your health care provider if you should:  Be screened for bone loss.  Take a calcium or vitamin D supplement to lower your risk of fractures.  Be given hormone replacement therapy (HRT) to treat symptoms of menopause. Follow these instructions at home: Lifestyle  Do not use any products that contain nicotine or tobacco, such as cigarettes, e-cigarettes, and chewing tobacco. If you need help quitting, ask your health care provider.  Do not use street drugs.  Do not share needles.  Ask your health care provider for help if you need support or information about quitting drugs. Alcohol use  Do not drink alcohol if: ? Your health care provider tells you not to drink. ? You are pregnant, may be pregnant, or are planning to become pregnant.  If you drink alcohol: ? Limit how much you use to 0-1 drink a day. ? Limit intake if you are breastfeeding.  Be aware of how much alcohol is in your drink. In the [place] one drink equals one 12 oz bottle of beer (355 mL), one 5 oz glass of wine (148 mL), or one 1 oz glass of hard liquor (44 mL). General instructions  Schedule regular health, dental, and eye exams.  Stay current with your vaccines.  Tell your health care provider if: ? You often feel depressed. ? You have ever been abused or do not feel safe at home. Summary  Adopting a healthy lifestyle and getting preventive care are important in promoting health and wellness.  Follow your health care provider's instructions about healthy  diet, exercising, and getting tested or screened for diseases.  Follow your health care provider's instructions on monitoring your cholesterol and blood pressure. This information is not intended to replace advice given to you by your health care provider. Make sure you discuss any questions you have with your health care provider. Document Revised: 12/14/2017 Document Reviewed: 12/14/2017 Elsevier Patient Education  2021 [employer]  Preventive Care 40-64 Years Old, Female Preventive care refers to lifestyle choices and visits with your health care provider that can promote health and wellness. This includes:  A yearly physical exam. This is also called an annual wellness visit.  Regular dental and eye exams.  Immunizations.  Screening for certain conditions.  Healthy lifestyle choices, such as: ? Eating a healthy diet. ? Getting regular exercise. ? Not using drugs or products that contain nicotine and tobacco. ? Limiting alcohol use. What can I expect for my preventive care visit? Physical exam Your health care provider will check your:  Height and weight. These may   be used to calculate your BMI (body mass index). BMI is a measurement that tells if you are at a healthy weight.  Heart rate and blood pressure.  Body temperature.  Skin for abnormal spots. Counseling Your health care provider may ask you questions about your:  Past medical problems.  Family's medical history.  Alcohol, tobacco, and drug use.  Emotional well-being.  Home life and relationship well-being.  Sexual activity.  Diet, exercise, and sleep habits.  Work and work Statistician.  Access to firearms.  Method of birth control.  Menstrual cycle.  Pregnancy history. What immunizations do I need? Vaccines are usually given at various ages, according to a schedule. Your health care provider will recommend vaccines for you based on your age, medical history, and lifestyle or other factors,  such as travel or where you work.   What tests do I need? Blood tests  Lipid and cholesterol levels. These may be checked every 5 years, or more often if you are over 65 years old.  Hepatitis C test.  Hepatitis B test. Screening  Lung cancer screening. You may have this screening every year starting at age 90 if you have a 30-pack-year history of smoking and currently smoke or have quit within the past 15 years.  Colorectal cancer screening. ? All adults should have this screening starting at age 16 and continuing until age 38. ? Your health care provider may recommend screening at age 30 if you are at increased risk. ? You will have tests every 1-10 years, depending on your results and the type of screening test.  Diabetes screening. ? This is done by checking your blood sugar (glucose) after you have not eaten for a while (fasting). ? You may have this done every 1-3 years.  Mammogram. ? This may be done every 1-2 years. ? Talk with your health care provider about when you should start having regular mammograms. This may depend on whether you have a family history of breast cancer.  BRCA-related cancer screening. This may be done if you have a family history of breast, ovarian, tubal, or peritoneal cancers.  Pelvic exam and Pap test. ? This may be done every 3 years starting at age 58. ? Starting at age 60, this may be done every 5 years if you have a Pap test in combination with an HPV test. Other tests  STD (sexually transmitted disease) testing, if you are at risk.  Bone density scan. This is done to screen for osteoporosis. You may have this scan if you are at high risk for osteoporosis. Talk with your health care provider about your test results, treatment options, and if necessary, the need for more tests. Follow these instructions at home: Eating and drinking  Eat a diet that includes fresh fruits and vegetables, whole grains, lean protein, and low-fat dairy  products.  Take vitamin and mineral supplements as recommended by your health care provider.  Do not drink alcohol if: ? Your health care provider tells you not to drink. ? You are pregnant, may be pregnant, or are planning to become pregnant.  If you drink alcohol: ? Limit how much you have to 0-1 drink a day. ? Be aware of how much alcohol is in your drink. In the [place] one drink equals one 12 oz bottle of beer (355 mL), one 5 oz glass of wine (148 mL), or one 1 oz glass of hard liquor (44 mL).   Lifestyle  Take daily care of your teeth and  gums. Brush your teeth every morning and night with fluoride toothpaste. Floss one time each day.  Stay active. Exercise for at least 30 minutes 5 or more days each week.  Do not use any products that contain nicotine or tobacco, such as cigarettes, e-cigarettes, and chewing tobacco. If you need help quitting, ask your health care provider.  Do not use drugs.  If you are sexually active, practice safe sex. Use a condom or other form of protection to prevent STIs (sexually transmitted infections).  If you do not wish to become pregnant, use a form of birth control. If you plan to become pregnant, see your health care provider for a prepregnancy visit.  If told by your health care provider, take low-dose aspirin daily starting at age 61.  Find healthy ways to cope with stress, such as: ? Meditation, yoga, or listening to music. ? Journaling. ? Talking to a trusted person. ? Spending time with friends and family. Safety  Always wear your seat belt while driving or riding in a vehicle.  Do not drive: ? If you have been drinking alcohol. Do not ride with someone who has been drinking. ? When you are tired or distracted. ? While texting.  Wear a helmet and other protective equipment during sports activities.  If you have firearms in your house, make sure you follow all gun safety procedures. What's next?  Visit your health care provider  once a year for an annual wellness visit.  Ask your health care provider how often you should have your eyes and teeth checked.  Stay up to date on all vaccines. This information is not intended to replace advice given to you by your health care provider. Make sure you discuss any questions you have with your health care provider. Document Revised: 09/25/2019 Document Reviewed: 09/01/2017 Elsevier Patient Education  2021 [employer].

## 2020-05-23 NOTE — Progress Notes (Signed)
Established Patient Office Visit  Subjective:  Patient ID: Julie Hardy, female    DOB: 05-18-1970  Age: 50 y.o. MRN: [id number]  CC:  Chief Complaint  Patient presents with  . Depression    HPI SAIMA MONTERROSO presents for follow-up of stress and anxiety.  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  Its been an adjustment but far less stressful.  Stress levels have gone down significantly.  Her father had polyps with his screening colonoscopies and eventually was diagnosed with colon cancer.  She is agreeable to going orally for her first colonoscopy.  No thank you okay okay mild yeah lets order okay alright thank  Past Medical History:  Diagnosis Date  . Migraine     History reviewed. No pertinent surgical history.  Family History  Problem Relation Age of Onset  . Cancer Mother   . Hearing loss Mother   . Alcohol abuse Father   . Arthritis Father   . Hearing loss Father   . Heart disease Father   . Hyperlipidemia Father     Social History   Socioeconomic History  . Marital status: Married    Spouse name: Not on file  . Number of children: Not on file  . Years of education: Not on file  . Highest education level: Not on file  Occupational History  . Not on file  Tobacco Use  . Smoking status: Never Smoker  . Smokeless tobacco: Never Used  Vaping Use  . Vaping Use: Never used  Substance and Sexual Activity  . Alcohol use: Yes    Alcohol/week: 7.0 standard drinks    Types: 7 Glasses of wine per week    Comment: social  . Drug use: No  . Sexual activity: Yes    Partners: Male  Other Topics Concern  . Not on file  Social History Narrative  . Not on file   Social Determinants of Health   Financial Resource Strain: Not on file  Food Insecurity: Not on file  Transportation Needs: Not on file  Physical Activity: Not on file  Stress: Not on file  Social Connections: Not on file  Intimate Partner  Violence: Not on file    Outpatient Medications Prior to Visit  Medication Sig Dispense Refill  . Artificial Tear Ointment (DRY EYES OP) Place 1 drop into both eyes as needed.     Marland Kitchen levonorgestrel (MIRENA, 52 MG,) 20 MCG/24HR IUD 1 each by Intrauterine route once.     . loratadine (CLARITIN) 10 MG tablet Take 10 mg by mouth at bedtime.    . Vitamin D, Cholecalciferol, 25 MCG (1000 UT) CAPS Take 1 capsule by mouth daily in the afternoon.    Marland Kitchen FLUoxetine (PROZAC) 20 MG capsule TAKE 1 CAPSULE(20 MG) BY MOUTH DAILY 90 capsule 1  . eszopiclone (LUNESTA) 2 MG TABS tablet TAKE 1 TABLET BY MOUTH EVERY NIGHT AT BEDTIME AS NEEDED FOR SLEEP (Patient not taking: No sig reported) 30 tablet 0  . Vitamin D, Ergocalciferol, (DRISDOL) 1.25 MG (50000 UNIT) CAPS capsule TAKE 1 CAPSULE BY MOUTH EVERY 7 DAYS 12 capsule 0   No facility-administered medications prior to visit.    Allergies  Allergen Reactions  . Amoxicillin Hives  . Chocolate Flavor Other (See Comments)    headache    ROS Review of Systems  Constitutional: Negative.   HENT: Negative.   Eyes: Negative for photophobia and visual disturbance.  Respiratory: Negative.  Cardiovascular: Negative.   Gastrointestinal: Negative.   Endocrine: Negative for polyphagia and polyuria.  Genitourinary: Negative.   Allergic/Immunologic: Negative for immunocompromised state.  Neurological: Negative for speech difficulty and weakness.  Hematological: Does not bruise/bleed easily.  Psychiatric/Behavioral: Negative.    Depression screen [facility] 2/9 05/23/2020 04/03/2019 04/03/2019  Decreased Interest 0 1 0  Down, Depressed, Hopeless 0 1 0  PHQ - 2 Score 0 2 0  Altered sleeping 0 1 -  Tired, decreased energy 1 3 -  Change in appetite 0 1 -  Feeling bad or failure about yourself  0 1 -  Trouble concentrating 0 0 -  Moving slowly or fidgety/restless 0 1 -  Suicidal thoughts 0 0 -  PHQ-9 Score 1 9 -  Difficult doing work/chores Not difficult at all  Somewhat difficult -      Objective:    Physical Exam Vitals and nursing note reviewed.  Constitutional:      General: She is not in acute distress.    Appearance: Normal appearance. She is normal weight. She is not ill-appearing, toxic-appearing or diaphoretic.  HENT:     Head: Normocephalic and atraumatic.     Right Ear: Tympanic membrane and ear canal normal.     Left Ear: Tympanic membrane, ear canal and external ear normal.     Mouth/Throat:     Mouth: Mucous membranes are moist.     Pharynx: Oropharynx is clear. No oropharyngeal exudate or posterior oropharyngeal erythema.  Eyes:     General: No scleral icterus.       Right eye: No discharge.        Left eye: No discharge.     Extraocular Movements: Extraocular movements intact.     Conjunctiva/sclera: Conjunctivae normal.     Pupils: Pupils are equal, round, and reactive to light.  Neck:     Vascular: No carotid bruit.  Cardiovascular:     Rate and Rhythm: Normal rate and regular rhythm.  Pulmonary:     Effort: Pulmonary effort is normal.     Breath sounds: Normal breath sounds.  Abdominal:     General: Bowel sounds are normal.  Musculoskeletal:     Cervical back: No rigidity or tenderness.  Lymphadenopathy:     Cervical: No cervical adenopathy.  Skin:    General: Skin is warm and dry.  Neurological:     Mental Status: She is alert and oriented to person, place, and time.  Psychiatric:        Mood and Affect: Mood normal.        Behavior: Behavior normal.     BP 110/70 (BP Location: Left Arm, Patient Position: Sitting, Cuff Size: Normal)   Pulse 71   Temp (!) 97 F (36.1 C) (Temporal)   Ht 5\' 4"  (1.626 m)   Wt 147 lb 6.1 oz (66.9 kg)   SpO2 100%   BMI 25.30 kg/m  Wt Readings from Last 3 Encounters:  05/23/20 147 lb 6.1 oz (66.9 kg)  05/08/19 159 lb (72.1 kg)  05/08/19 159 lb (72.1 kg)     Health Maintenance Due  Topic Date Due  . HIV Screening  Never done  . Hepatitis C Screening  Never done   . TETANUS/TDAP  Never done  . PAP SMEAR-Modifier  Never done  . COLONOSCOPY (Pts 45-8yrs Insurance coverage will need to be confirmed)  Never done  . COVID-19 Vaccine (3 - Booster) 08/24/2019    There are no preventive care reminders to display for this  patient.  Lab Results  Component Value Date   TSH 2.14 04/03/2019   Lab Results  Component Value Date   WBC 8.6 05/08/2019   HGB 14.3 05/08/2019   HCT 40.4 05/08/2019   MCV 91.4 05/08/2019   PLT 291 05/08/2019   Lab Results  Component Value Date   NA 136 05/08/2019   K 4.1 05/08/2019   CO2 21 (L) 05/08/2019   GLUCOSE 91 05/08/2019   BUN 15 05/08/2019   CREATININE 0.58 05/08/2019   BILITOT 0.8 05/08/2019   ALKPHOS 55 05/08/2019   AST 17 05/08/2019   ALT 19 05/08/2019   PROT 7.0 05/08/2019   ALBUMIN 4.1 05/08/2019   CALCIUM 9.1 05/08/2019   ANIONGAP 9 05/08/2019   GFR 69.85 04/03/2019   Lab Results  Component Value Date   CHOL 209 (H) 04/03/2019   Lab Results  Component Value Date   HDL 58.40 04/03/2019   Lab Results  Component Value Date   LDLCALC 122 (H) 04/03/2019   Lab Results  Component Value Date   TRIG 144.0 04/03/2019   Lab Results  Component Value Date   CHOLHDL 4 04/03/2019   No results found for: HGBA1C    Assessment & Plan:   Problem List Items Addressed This Visit      Other   Vitamin D deficiency - Primary   Relevant Orders   VITAMIN D 25 Hydroxy (Vit-D Deficiency, Fractures)   Depression, major, single episode, in partial remission (HCC)   Relevant Medications   FLUoxetine (PROZAC) 10 MG capsule   Healthcare maintenance   Relevant Orders   CBC   Comprehensive metabolic panel   LDL cholesterol, direct   Lipid panel   Urinalysis, Routine w reflex microscopic   Ambulatory referral to Gastroenterology      Meds ordered this encounter  Medications  . FLUoxetine (PROZAC) 10 MG capsule    Sig: Take 1 capsule (10 mg total) by mouth daily.    Dispense:  90 capsule     Refill:  1    Follow-up: Return 3-6 months..  Doing well and will like to try a lower dose of Prozac.  I agree we will try the 10 mg pill and follow-up in 3 to 6 months possible discontinuation.  Mliss Sax, MD

## 2020-08-18 ENCOUNTER — Other Ambulatory Visit: Payer: Self-pay | Admitting: Family Medicine

## 2020-08-18 DIAGNOSIS — F324 Major depressive disorder, single episode, in partial remission: Secondary | ICD-10-CM

## 2020-09-03 ENCOUNTER — Encounter: Payer: BC Managed Care – PPO | Admitting: Gastroenterology

## 2020-09-09 ENCOUNTER — Ambulatory Visit: Payer: BC Managed Care – PPO | Admitting: Family Medicine

## 2020-09-15 ENCOUNTER — Ambulatory Visit: Payer: BC Managed Care – PPO | Admitting: Family Medicine

## 2020-09-19 ENCOUNTER — Ambulatory Visit: Payer: BC Managed Care – PPO | Admitting: Family Medicine

## 2020-10-27 ENCOUNTER — Ambulatory Visit: Payer: BC Managed Care – PPO | Admitting: Family Medicine

## 2020-10-30 ENCOUNTER — Encounter: Payer: BC Managed Care – PPO | Admitting: Gastroenterology

## 2020-11-29 IMAGING — CT CT ANGIO NECK
3 of 7 series · 10 of 36 positions shown · IV contrast (omnipaque)
Comparison: MRI head and CT head 05/08/2019

CLINICAL DATA: Headache and paresthesia. Left-sided weakness.
Numbness left face and arm.

EXAM:
CT ANGIOGRAPHY HEAD AND NECK
TECHNIQUE: Multidetector CT imaging of the head and neck was performed using
the standard protocol during bolus administration of intravenous
contrast. Multiplanar CT image reconstructions and MIPs were
obtained to evaluate the vascular anatomy. Carotid stenosis
measurements (when applicable) are obtained utilizing NASCET
criteria, using the distal internal carotid diameter as the
denominator.
CONTRAST:  100mL OMNIPAQUE IOHEXOL 350 MG/ML SOLN

[Series 5: cta neck · axial · 0.42mm/px · z∈[+1338,+1450]mm · 2 of 170 slices shown]
[im 57/170  soft-tissue]
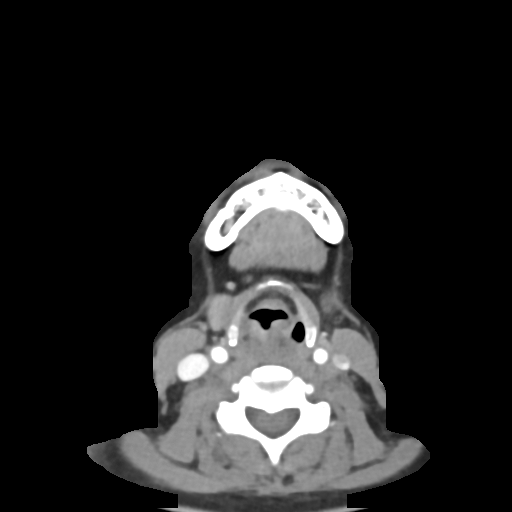
[im 113/170  soft-tissue]
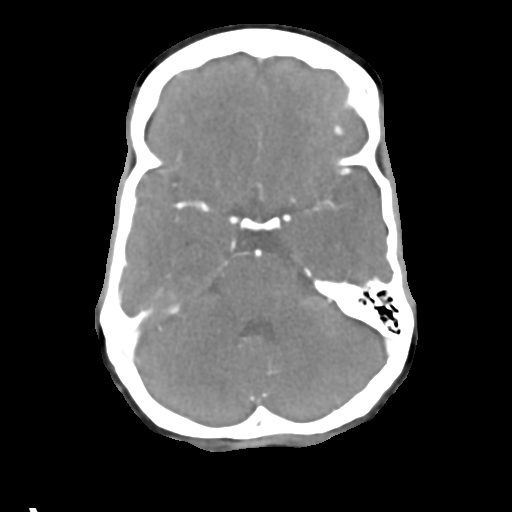

[Series 7: cta neck axial · axial · 0.39mm/px · z∈[+1226,+1473]mm · 6 of 361 slices shown]
[im 52/361  soft-tissue]
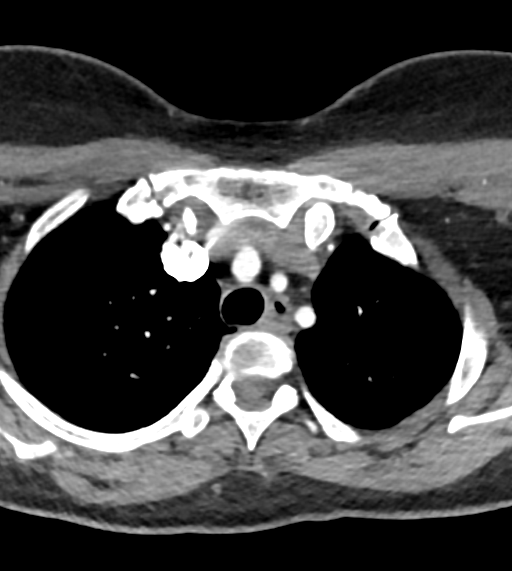
[im 103/361  bone]
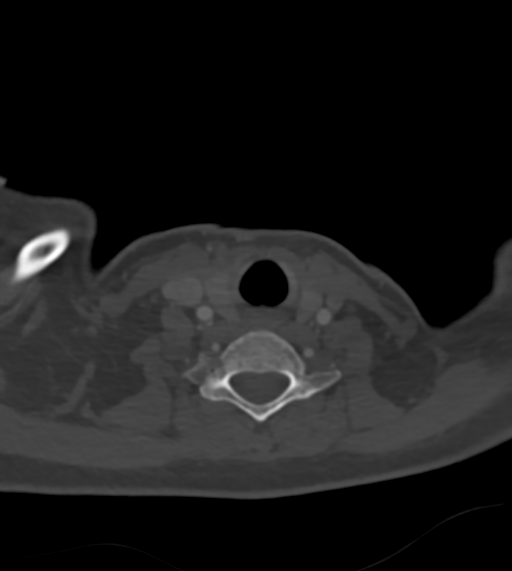
[im 155/361  soft-tissue]
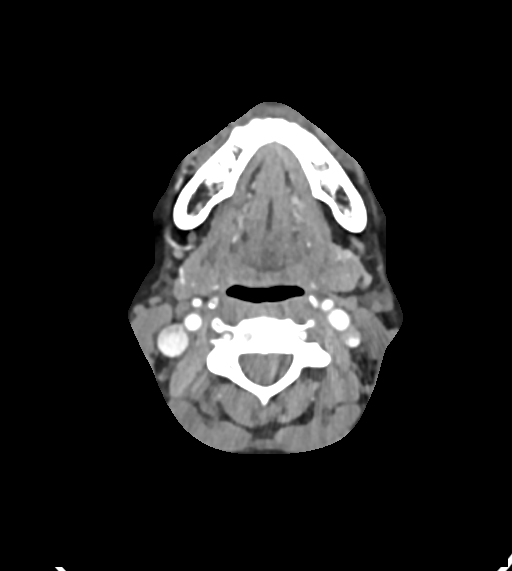
[im 206/361  bone]
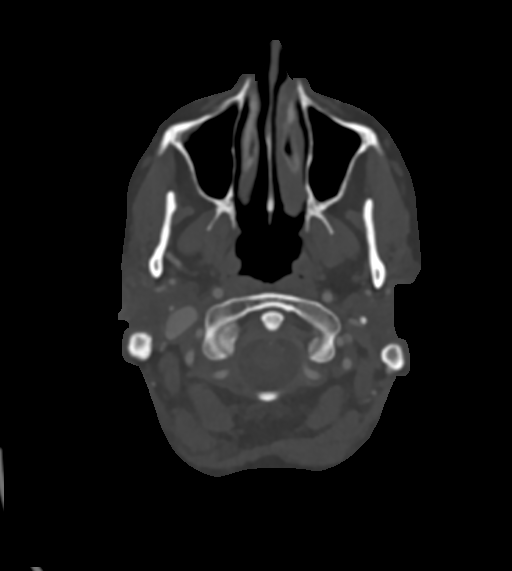
[im 258/361  soft-tissue]
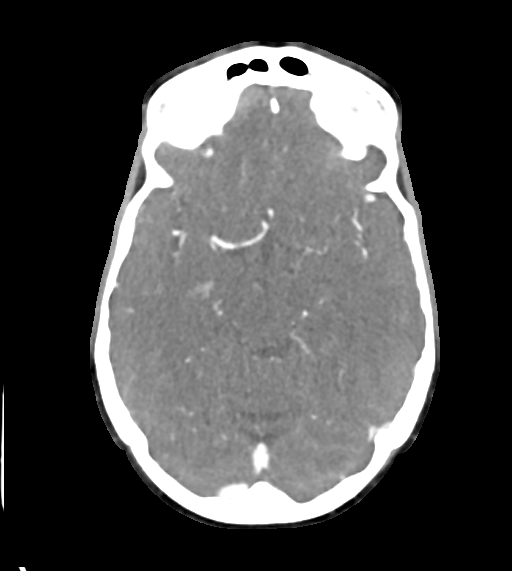
[im 309/361  bone]
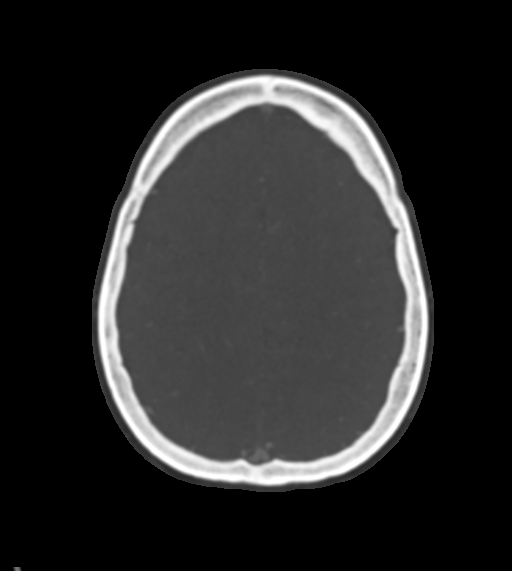

[Series 9: cta neck sagittal · sagittal · 0.43mm/px · 2 of 201 slices shown]
[im 48/201  soft-tissue]
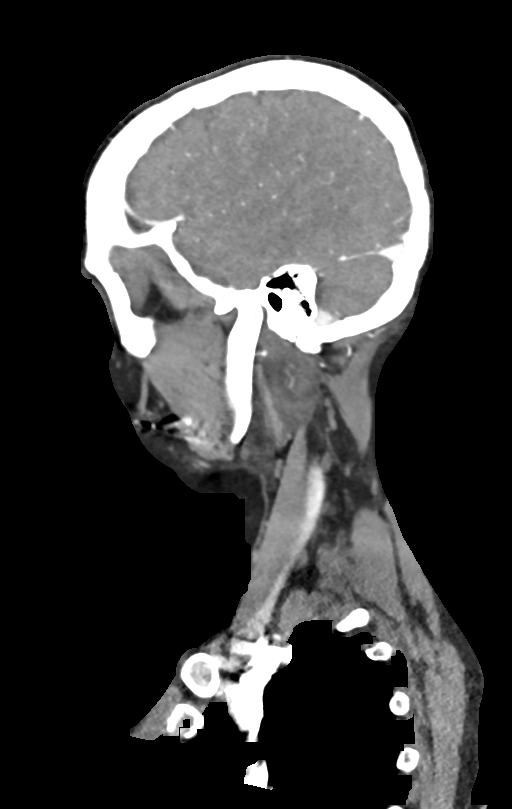
[im 154/201  soft-tissue]
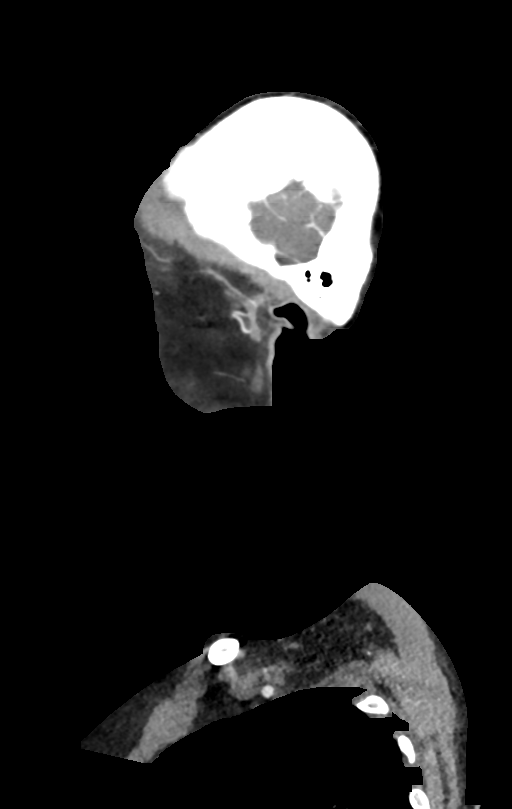

[10 of 36 positions shown; findings below may reference images not displayed]

FINDINGS: CTA NECK FINDINGS

Aortic arch: Standard branching. Imaged portion shows no evidence of
aneurysm or dissection. No significant stenosis of the major arch
vessel origins.

Right carotid system: Normal right carotid system. Negative for
stenosis or dissection. No significant atherosclerotic disease

Left carotid system: Normal left carotid system. Negative for
stenosis dissection or atherosclerotic disease

Vertebral arteries: Normal vertebral arteries bilaterally.

Skeleton: No acute skeletal abnormality.

Other neck: Negative

Upper chest: Negative

Review of the MIP images confirms the above findings

CTA HEAD FINDINGS

Anterior circulation: Cavernous carotid widely patent bilaterally.
Anterior and middle cerebral arteries normal bilaterally.

Posterior circulation: Both vertebral arteries patent to the
basilar. PICA patent bilaterally. Basilar widely patent. AICA,
superior cerebellar, posterior cerebral arteries patent bilaterally
without stenosis.

Venous sinuses: Negative

Anatomic variants: None

Review of the MIP images confirms the above findings
IMPRESSION: Normal CTA head neck.

## 2020-12-12 ENCOUNTER — Ambulatory Visit: Payer: BC Managed Care – PPO | Admitting: Family Medicine

## 2021-01-15 ENCOUNTER — Encounter: Payer: Self-pay | Admitting: Family Medicine

## 2021-01-15 ENCOUNTER — Ambulatory Visit: Payer: BC Managed Care – PPO

## 2021-01-15 ENCOUNTER — Ambulatory Visit: Payer: BC Managed Care – PPO | Admitting: Family Medicine

## 2021-01-15 ENCOUNTER — Other Ambulatory Visit: Payer: Self-pay

## 2021-01-15 VITALS — BP 116/74 | HR 70 | Temp 97.1°F | Ht 64.0 in | Wt 161.2 lb

## 2021-01-15 DIAGNOSIS — M19041 Primary osteoarthritis, right hand: Secondary | ICD-10-CM

## 2021-01-15 DIAGNOSIS — Z23 Encounter for immunization: Secondary | ICD-10-CM | POA: Diagnosis not present

## 2021-01-15 DIAGNOSIS — G43109 Migraine with aura, not intractable, without status migrainosus: Secondary | ICD-10-CM | POA: Diagnosis not present

## 2021-01-15 DIAGNOSIS — M19042 Primary osteoarthritis, left hand: Secondary | ICD-10-CM | POA: Insufficient documentation

## 2021-01-15 DIAGNOSIS — F341 Dysthymic disorder: Secondary | ICD-10-CM | POA: Insufficient documentation

## 2021-01-15 MED ORDER — DICLOFENAC SODIUM 1 % EX GEL: CUTANEOUS | 2 refills | Status: DC

## 2021-01-15 NOTE — Progress Notes (Signed)
Established Patient Office Visit  Subjective:  Patient ID: Julie Hardy, female    DOB: May 21, 1970  Age: 51 y.o. MRN: [id number]  CC:  Chief Complaint  Patient presents with   Follow-up    F/u meds.  Wants flu shot today.  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  Using topical creme  and Ibuprofen.      HPI Julie Hardy presents for follow-up of dysthymia, pains in her hands around her thumbs at the base.  She is also been having a strange sensation in the left.  Orbital area.  It comes and goes at random.  Recent eye check was normal.  She has no sinus symptoms at this time.  Denies headaches.  She does have a history of migraine headaches that tended to occur in this area in the distant past.  Fortunately these have not bothered her since her pregnancies.  Lower dose fluoxetine has been helpful for her anxiety and dysthymia.  Past Medical History:  Diagnosis Date   Migraine     History reviewed. No pertinent surgical history.  Family History  Problem Relation Age of Onset   Cancer Mother    Hearing loss Mother    Alcohol abuse Father    Arthritis Father    Hearing loss Father    Heart disease Father    Hyperlipidemia Father     Social History   Socioeconomic History   Marital status: Married    Spouse name: Not on file   Number of children: Not on file   Years of education: Not on file   Highest education level: Not on file  Occupational History   Not on file  Tobacco Use   Smoking status: Never   Smokeless tobacco: Never  Vaping Use   Vaping Use: Never used  Substance and Sexual Activity   Alcohol use: Yes    Alcohol/week: 7.0 standard drinks    Types: 7 Glasses of wine per week    Comment: social   Drug use: No   Sexual activity: Yes    Partners: Male  Other Topics Concern   Not on file  Social History Narrative   Not on file   Social Determinants of Health   Financial Resource Strain: Not on file  Food Insecurity: Not on  file  Transportation Needs: Not on file  Physical Activity: Not on file  Stress: Not on file  Social Connections: Not on file  Intimate Partner Violence: Not on file    Outpatient Medications Prior to Visit  Medication Sig Dispense Refill   Artificial Tear Ointment (DRY EYES OP) Place 1 drop into both eyes as needed.      b complex vitamins capsule Take 1 capsule by mouth daily.     FLUoxetine (PROZAC) 10 MG capsule TAKE 1 CAPSULE(10 MG) BY MOUTH DAILY 90 capsule 1   levonorgestrel (MIRENA, 52 MG,) 20 MCG/24HR IUD 1 each by Intrauterine route once.      loratadine (CLARITIN) 10 MG tablet Take 10 mg by mouth at bedtime.     Vitamin D, Cholecalciferol, 25 MCG (1000 UT) CAPS Take 1 capsule by mouth daily in the afternoon.     No facility-administered medications prior to visit.    Allergies  Allergen Reactions   Amoxicillin Hives   Chocolate Flavor Other (See Comments)    headache    ROS Review of Systems  Constitutional:  Negative for chills, diaphoresis, fatigue, fever and unexpected weight change.  HENT: Negative.  Eyes:  Negative for photophobia and visual disturbance.  Respiratory: Negative.    Cardiovascular: Negative.   Gastrointestinal: Negative.   Endocrine: Negative for polyphagia and polyuria.  Genitourinary: Negative.   Musculoskeletal: Negative.   Neurological:  Positive for numbness. Negative for dizziness, facial asymmetry, speech difficulty, weakness, light-headedness and headaches.  Psychiatric/Behavioral: Negative.       Objective:    Physical Exam Vitals and nursing note reviewed.  Constitutional:      General: She is not in acute distress.    Appearance: Normal appearance. She is normal weight. She is not ill-appearing, toxic-appearing or diaphoretic.  HENT:     Head: Normocephalic and atraumatic.     Right Ear: Tympanic membrane, ear canal and external ear normal.     Left Ear: Tympanic membrane, ear canal and external ear normal.      Mouth/Throat:     Mouth: Mucous membranes are moist.     Pharynx: Oropharynx is clear. No oropharyngeal exudate or posterior oropharyngeal erythema.  Eyes:     General: No visual field deficit.       Right eye: No discharge.        Left eye: No discharge.     Extraocular Movements: Extraocular movements intact.     Conjunctiva/sclera: Conjunctivae normal.     Pupils: Pupils are equal, round, and reactive to light.  Neck:     Vascular: No carotid bruit.  Cardiovascular:     Rate and Rhythm: Normal rate and regular rhythm.  Pulmonary:     Effort: Pulmonary effort is normal.     Breath sounds: Normal breath sounds.  Abdominal:     General: Bowel sounds are normal.  Musculoskeletal:     Cervical back: No rigidity or tenderness.     Right lower leg: No edema.     Left lower leg: No edema.  Skin:    General: Skin is warm and dry.  Neurological:     Mental Status: She is alert and oriented to person, place, and time.     Cranial Nerves: No cranial nerve deficit, dysarthria or facial asymmetry.     Motor: No weakness.  Psychiatric:        Mood and Affect: Mood normal.        Behavior: Behavior normal.    BP 116/74    Pulse 70    Temp (!) 97.1 F (36.2 C) (Temporal)    Ht 5\' 4"  (1.626 m)    Wt 161 lb 3.2 oz (73.1 kg)    SpO2 97%    BMI 27.67 kg/m  Wt Readings from Last 3 Encounters:  01/15/21 161 lb 3.2 oz (73.1 kg)  05/23/20 147 lb 6.1 oz (66.9 kg)  05/08/19 159 lb (72.1 kg)     Health Maintenance Due  Topic Date Due   HIV Screening  Never done   Hepatitis C Screening  Never done   PAP SMEAR-Modifier  Never done   COLONOSCOPY (Pts 45-52yrs Insurance coverage will need to be confirmed)  Never done   INFLUENZA VACCINE  08/04/2020    There are no preventive care reminders to display for this patient.  Lab Results  Component Value Date   TSH 2.14 04/03/2019   Lab Results  Component Value Date   WBC 8.7 05/23/2020   HGB 13.6 05/23/2020   HCT 40.5 05/23/2020   MCV  95.5 05/23/2020   PLT 273.0 05/23/2020   Lab Results  Component Value Date   NA 138 05/23/2020   K  4.5 05/23/2020   CO2 26 05/23/2020   GLUCOSE 76 05/23/2020   BUN 16 05/23/2020   CREATININE 0.71 05/23/2020   BILITOT 0.6 05/23/2020   ALKPHOS 58 05/23/2020   AST 13 05/23/2020   ALT 18 05/23/2020   PROT 6.5 05/23/2020   ALBUMIN 4.3 05/23/2020   CALCIUM 9.5 05/23/2020   ANIONGAP 9 05/08/2019   GFR 100.88 05/23/2020   Lab Results  Component Value Date   CHOL 169 05/23/2020   Lab Results  Component Value Date   HDL 54.50 05/23/2020   Lab Results  Component Value Date   LDLCALC 97 05/23/2020   Lab Results  Component Value Date   TRIG 88.0 05/23/2020   Lab Results  Component Value Date   CHOLHDL 3 05/23/2020   No results found for: HGBA1C    Assessment & Plan:      Meds ordered this encounter  Medications   diclofenac Sodium (VOLTAREN) 1 % GEL    Sig: Apply a large pea sized dollop on tender spot of hands up to 4 times daily.    Dispense:  150 g    Refill:  2    Follow-up: Return in about 6 months (around 07/15/2021), or if symptoms worsen or fail to improve.    Libby Maw, MD

## 2021-01-22 ENCOUNTER — Telehealth: Payer: Self-pay

## 2021-01-22 NOTE — Telephone Encounter (Signed)
PA for Voltaren Gel 1% subitted by fax trhough cover my meds. Waiting on response.  Dm/cma   Key: [id number])

## 2021-03-01 ENCOUNTER — Other Ambulatory Visit: Payer: Self-pay | Admitting: Family Medicine

## 2021-03-01 DIAGNOSIS — F324 Major depressive disorder, single episode, in partial remission: Secondary | ICD-10-CM

## 2021-04-28 ENCOUNTER — Encounter: Payer: Self-pay | Admitting: Family Medicine

## 2021-04-30 ENCOUNTER — Ambulatory Visit: Payer: BC Managed Care – PPO | Admitting: Family Medicine

## 2021-05-06 ENCOUNTER — Ambulatory Visit: Payer: BC Managed Care – PPO | Admitting: Family Medicine

## 2021-05-12 ENCOUNTER — Encounter: Payer: Self-pay | Admitting: Family Medicine

## 2021-05-12 ENCOUNTER — Ambulatory Visit: Payer: BC Managed Care – PPO | Admitting: Family Medicine

## 2021-05-12 VITALS — BP 122/70 | HR 70 | Temp 97.5°F | Ht 64.0 in | Wt 169.0 lb

## 2021-05-12 DIAGNOSIS — Z23 Encounter for immunization: Secondary | ICD-10-CM | POA: Diagnosis not present

## 2021-05-12 DIAGNOSIS — Z1211 Encounter for screening for malignant neoplasm of colon: Secondary | ICD-10-CM

## 2021-05-12 DIAGNOSIS — R5383 Other fatigue: Secondary | ICD-10-CM

## 2021-05-12 DIAGNOSIS — F341 Dysthymic disorder: Secondary | ICD-10-CM | POA: Diagnosis not present

## 2021-05-12 DIAGNOSIS — Z111 Encounter for screening for respiratory tuberculosis: Secondary | ICD-10-CM

## 2021-05-12 LAB — COMPREHENSIVE METABOLIC PANEL
ALT: 14 U/L (ref 0–35)
AST: 14 U/L (ref 0–37)
Albumin: 4.5 g/dL (ref 3.5–5.2)
Alkaline Phosphatase: 52 U/L (ref 39–117)
BUN: 13 mg/dL (ref 6–23)
CO2: 25 mEq/L (ref 19–32)
Calcium: 9.3 mg/dL (ref 8.4–10.5)
Chloride: 103 mEq/L (ref 96–112)
Creatinine, Ser: 0.68 mg/dL (ref 0.40–1.20)
GFR: 102.63 mL/min (ref >60.00)
Glucose, Bld: 78 mg/dL (ref 70–99)
Potassium: 4.3 mEq/L (ref 3.5–5.1)
Sodium: 136 mEq/L (ref 135–145)
Total Bilirubin: 0.7 mg/dL (ref 0.2–1.2)
Total Protein: 7.3 g/dL (ref 6.0–8.3)

## 2021-05-12 LAB — CBC
HCT: 43 % (ref 36.0–46.0)
Hemoglobin: 14.2 g/dL (ref 12.0–15.0)
MCHC: 33.1 g/dL (ref 30.0–36.0)
MCV: 95.7 fl (ref 78.0–100.0)
Platelets: 284 10*3/uL (ref 150.0–400.0)
RBC: 4.49 Mil/uL (ref 3.87–5.11)
RDW: 12.7 % (ref 11.5–15.5)
WBC: 7.5 10*3/uL (ref 4.0–10.5)

## 2021-05-12 LAB — TSH: TSH: 1.58 u[IU]/mL (ref 0.35–5.50)

## 2021-05-12 LAB — VITAMIN B12: Vitamin B-12: 509 pg/mL (ref 211–911)

## 2021-05-12 MED ORDER — FLUOXETINE HCL 20 MG PO CAPS: 20.0000 mg | ORAL_CAPSULE | Freq: Every day | ORAL | 3 refills | Status: DC

## 2021-05-12 NOTE — Progress Notes (Signed)
? ?Established Patient Office Visit ? ?Subjective   ?Patient ID: Julie Hardy, female    DOB: [birth year]  Age: 51 y.o. MRN: [id number] ? ?Chief Complaint  ?Patient presents with  ? Fatigue  ?  Fatigue with lack of motivation, weight gain not sure what the issue is. Patient would also like TB test today.   ? ? ?HPI for follow-up of dysthymia, fatigue and need for TB screening.  Continues to work from home and feels a little isolated.  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  Julie Hardy needs to be screened for TB.  Exercising almost daily.  Just feels a lack of motivation.  There are some fatigue as well.  Sleeping well.  Having no issues with the lower dose of fluoxetine.  Averages 1 drink daily. ? ? ? ?Review of Systems  ?Constitutional:  Positive for malaise/fatigue. Negative for chills, diaphoresis and weight loss.  ?HENT: Negative.    ?Eyes: Negative.  Negative for blurred vision and double vision.  ?Cardiovascular:  Negative for chest pain.  ?Gastrointestinal:  Negative for abdominal pain.  ?Genitourinary: Negative.   ?Musculoskeletal:  Negative for falls and myalgias.  ?Neurological:  Negative for speech change, loss of consciousness and weakness.  ?Psychiatric/Behavioral: Negative.    ? ?  05/12/2021  ? 10:48 AM 01/15/2021  ?  4:30 PM 05/23/2020  ?  9:45 AM  ?Depression screen PHQ 2/9  ?Decreased Interest 1 1 0  ?Down, Depressed, Hopeless 0 0 0  ?PHQ - 2 Score 1 1 0  ?Altered sleeping  1 0  ?Tired, decreased energy  1 1  ?Change in appetite  0 0  ?Feeling bad or failure about yourself   0 0  ?Trouble concentrating  0 0  ?Moving slowly or fidgety/restless  0 0  ?Suicidal thoughts  0 0  ?PHQ-9 Score  3 1  ?Difficult doing work/chores  Not difficult at all Not difficult at all  ? ? ? ?  ?Objective:  ?  ? ?BP 122/70 (BP Location: Right Arm, Patient Position: Sitting, Cuff Size: Normal)   Pulse 70   Temp (!) 97.5 ?F (36.4 ?C) (Temporal)   Ht 5\' 4"  (1.626 m)   Wt 169 lb (76.7 kg)   SpO2 99%    BMI 29.01 kg/m?  ? ? ?Physical Exam ?Constitutional:   ?   General: Julie Hardy is not in acute distress. ?   Appearance: Normal appearance. Julie Hardy is not ill-appearing, toxic-appearing or diaphoretic.  ?HENT:  ?   Head: Normocephalic and atraumatic.  ?   Right Ear: External ear normal.  ?   Left Ear: External ear normal.  ?Eyes:  ?   General: No scleral icterus.    ?   Right eye: No discharge.     ?   Left eye: No discharge.  ?   Extraocular Movements: Extraocular movements intact.  ?   Conjunctiva/sclera: Conjunctivae normal.  ?Cardiovascular:  ?   Rate and Rhythm: Normal rate and regular rhythm.  ?Pulmonary:  ?   Effort: Pulmonary effort is normal. No respiratory distress.  ?   Breath sounds: Normal breath sounds.  ?Abdominal:  ?   General: Bowel sounds are normal.  ?Musculoskeletal:  ?   Cervical back: No rigidity or tenderness.  ?Skin: ?   General: Skin is warm and dry.  ?Neurological:  ?   Mental Status: Julie Hardy is alert and oriented to person, place, and time.  ?Psychiatric:     ?  Mood and Affect: Mood normal.     ?   Behavior: Behavior normal.  ? ? ? ?No results found for any visits on 05/12/21. ? ? ? ?The ASCVD Risk score (Arnett DK, et al., 2019) failed to calculate for the following reasons: ?  The patient has a prior MI or stroke diagnosis ? ?  ?Assessment & Plan:  ? ?Problem List Items Addressed This Visit   ? ?  ? Other  ? Screen for colon cancer  ? Relevant Orders  ? Ambulatory referral to Gastroenterology  ? Dysthymia - Primary  ? Relevant Medications  ? FLUoxetine (PROZAC) 20 MG capsule  ? Other fatigue  ? Relevant Orders  ? Vitamin B12  ? TSH  ? CBC  ? Comprehensive metabolic panel  ? Need for vaccination to prevent tuberculosis  ? Relevant Orders  ? PPD  ? Screening for tuberculosis  ? Relevant Orders  ? QuantiFERON-TB Gold Plus  ? ? ?Return in about 8 weeks (around 07/07/2021), or if symptoms worsen or fail to improve.  ? ?Have increased fluoxetine to 20 mg daily.  We will check B12 and TSH levels.  Due for  screening colonoscopy. ?Mliss Sax, MD ? ?

## 2021-05-14 ENCOUNTER — Encounter: Payer: Self-pay | Admitting: Family Medicine

## 2021-05-15 LAB — QUANTIFERON-TB GOLD PLUS
Mitogen-NIL: >10 IU/mL
NIL: 0.04 IU/mL
QuantiFERON-TB Gold Plus: NEGATIVE
TB1-NIL: 0 IU/mL
TB2-NIL: 0.01 IU/mL

## 2021-06-23 ENCOUNTER — Ambulatory Visit: Payer: BC Managed Care – PPO | Admitting: Family Medicine

## 2021-06-23 ENCOUNTER — Encounter: Payer: Self-pay | Admitting: Family Medicine

## 2021-06-23 VITALS — BP 140/98 | HR 72 | Temp 96.9°F | Ht 64.0 in | Wt 165.2 lb

## 2021-06-23 DIAGNOSIS — J029 Acute pharyngitis, unspecified: Secondary | ICD-10-CM | POA: Diagnosis not present

## 2021-06-23 LAB — POCT RAPID STREP A (OFFICE): Rapid Strep A Screen: NEGATIVE

## 2021-06-23 MED ORDER — BENZONATATE 100 MG PO CAPS: 100.0000 mg | ORAL_CAPSULE | Freq: Two times a day (BID) | ORAL | 0 refills | Status: DC | PRN

## 2021-06-23 MED ORDER — IBUPROFEN 600 MG PO TABS: 600.0000 mg | ORAL_TABLET | Freq: Three times a day (TID) | ORAL | 0 refills | Status: AC | PRN

## 2021-06-23 NOTE — Progress Notes (Signed)
Julie Hardy is a 51 y.o. female presenting with a sore throat for 1 week.  Associated symptoms include:  chills, nasal/sinus congestion, and cough .  Symptoms are improving.  Home treatment thus far includes:  rest, NSAIDS/acetaminophen, and OTC sore throat / cold products.  There is a previous history of of similar symptoms.  At home COVID testing: Negative  Exam:  BP (!) 140/98 (BP Location: Right Arm, Patient Position: Sitting, Cuff Size: Normal)   Pulse 72   Temp (!) 96.9 F (36.1 C) (Temporal)   Ht 5\' 4"  (1.626 m)   Wt 165 lb 3.2 oz (74.9 kg)   SpO2 97%   BMI 28.36 kg/m  Gen: NAD, resting comfortably HEENT: Erythematous.  Oropharynx CV: RRR with no murmurs appreciated Pulm: NWOB, CTAB with no crackles, wheezes, or rhonchi GI: Normal bowel sounds present. Soft, Nontender, Nondistended. MSK: no edema, cyanosis, or clubbing noted Skin: warm, dry Neuro: grossly normal, moves all extremities Psych: Normal affect and thought content  Sore throat URI associated symptoms Likely viral, cannot rule out bacterial cause Strep negative Recommend ibuprofen Tessalon Perles Return precautions discussed Follow-up as needed

## 2021-07-10 ENCOUNTER — Ambulatory Visit: Payer: BC Managed Care – PPO | Admitting: Family Medicine

## 2022-01-10 ENCOUNTER — Other Ambulatory Visit: Payer: Self-pay | Admitting: Family Medicine

## 2022-01-10 DIAGNOSIS — F324 Major depressive disorder, single episode, in partial remission: Secondary | ICD-10-CM

## 2022-02-12 ENCOUNTER — Other Ambulatory Visit: Payer: Self-pay | Admitting: Family Medicine

## 2022-02-12 DIAGNOSIS — F324 Major depressive disorder, single episode, in partial remission: Secondary | ICD-10-CM

## 2022-02-16 ENCOUNTER — Telehealth: Payer: Self-pay | Admitting: Family Medicine

## 2022-02-16 NOTE — Telephone Encounter (Signed)
Err

## 2022-02-16 NOTE — Telephone Encounter (Signed)
Patient called to follow up on this, she said that she thought she still had a refill but said that the pharmacy sent for a refill request and it was denied. Patient would like someone to follow up with her. Please call back at [phone]

## 2022-02-18 ENCOUNTER — Telehealth: Payer: Self-pay | Admitting: Family Medicine

## 2022-02-18 DIAGNOSIS — F341 Dysthymic disorder: Secondary | ICD-10-CM

## 2022-02-18 MED ORDER — FLUOXETINE HCL 20 MG PO CAPS: 20.0000 mg | ORAL_CAPSULE | Freq: Every day | ORAL | 1 refills | Status: DC

## 2022-02-18 NOTE — Telephone Encounter (Signed)
Patient aware Rx sent to pharmacy. Appointment scheduled for follow up.

## 2022-02-18 NOTE — Telephone Encounter (Signed)
Caller Name: Tyshay Call back phone #: [phone]  Reason for Call: Pt went to pharmacy to refill her fluoxetine and was told they could not because she did not have any refills. She thought she did and would like a call back to clear this up.

## 2022-02-18 NOTE — Telephone Encounter (Signed)
Refill request for pending Rx last OV with increase in dosage 05/12/21. Please advise

## 2022-03-11 ENCOUNTER — Ambulatory Visit: Payer: BC Managed Care – PPO | Admitting: Family Medicine

## 2022-08-14 ENCOUNTER — Other Ambulatory Visit: Payer: Self-pay | Admitting: Family Medicine

## 2022-08-14 DIAGNOSIS — F341 Dysthymic disorder: Secondary | ICD-10-CM

## 2022-08-27 ENCOUNTER — Ambulatory Visit: Payer: BC Managed Care – PPO | Admitting: Family Medicine

## 2022-08-27 VITALS — BP 140/98 | HR 74 | Temp 98.1°F | Wt 169.0 lb

## 2022-08-27 DIAGNOSIS — H1011 Acute atopic conjunctivitis, right eye: Secondary | ICD-10-CM

## 2022-08-27 DIAGNOSIS — Z1211 Encounter for screening for malignant neoplasm of colon: Secondary | ICD-10-CM

## 2022-08-27 NOTE — Progress Notes (Signed)
[facility] [address] [place] [address] Dept: [phone] Dept Fax: [phone]  Office Visit  Subjective:    Patient ID: Julie Hardy, female    DOB: 01-14-1970, 52 y.o..   MRN: [id number]  Chief Complaint  Patient presents with   Conjunctivitis    Possible right eye conjunctivitis. Started itching last night, thick mucus on right eye this morning. Heavy pain, feels slight swelling. PT interested in colonoscopy.   History of Present Illness:  Patient is in today concerned for a possible conjunctivitis. She notes some itching in the eye last night. This morning she had some mucousy discharge from the eye. She is not having any symptoms currently. She was concerned that something might flare up over the weekend. She denies any runny nose congestion, or other cold symptoms. She notes she had a bacterial infection 2 months ago that she notes related to sleeping with her pet.  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 Family History  Problem Relation Age of Onset   Cancer Mother    Hearing loss Mother    Alcohol abuse Father    Arthritis Father    Hearing loss Father    Heart disease Father    Hyperlipidemia Father    Outpatient  Medications Prior to Visit  Medication Sig Dispense Refill   Artificial Tear Ointment (DRY EYES OP) Place 1 drop into both eyes as needed.      b complex vitamins capsule Take 1 capsule by mouth daily.     diclofenac Sodium (VOLTAREN) 1 % GEL Apply a large pea sized dollop on tender spot of hands up to 4 times daily. 150 g 2   FLUoxetine (PROZAC) 20 MG capsule TAKE 1 CAPSULE(20 MG) BY MOUTH DAILY 90 capsule 1   ibuprofen (ADVIL) 600 MG tablet Take 1 tablet (600 mg total) by mouth every 8 (eight) hours as needed. 30 tablet 0   levonorgestrel (MIRENA, 52 MG,) 20 MCG/24HR IUD 1 each by Intrauterine route once.      loratadine (CLARITIN) 10 MG tablet Take 10 mg by mouth at bedtime.     Vitamin D, Cholecalciferol, 25 MCG (1000 UT) CAPS Take 1 capsule by mouth daily in the afternoon.     benzonatate (TESSALON) 100 MG capsule Take 1 capsule (100 mg total) by mouth 2 (two) times daily as needed for cough. (Patient not taking: Reported on 08/27/2022) 20 capsule 0   No facility-administered medications prior to visit.   Allergies  Allergen Reactions   Amoxicillin Hives   Chocolate Flavor Other (See Comments)    headache     Objective:   Today's Vitals   08/27/22 1343  BP: (!) 140/98  Pulse: 74  Temp: 98.1 F (36.7 C)  TempSrc: Temporal  SpO2: 99%  Weight: 169 lb (76.7  kg)   Body mass index is 29.01 kg/m.   General: Well developed, well nourished. No acute distress. Eye: OD is clear. There is no periorbital swelling. No mattering present. The conjunctiva is not injected/red. Psych: Alert and oriented. Normal mood and affect.  Health Maintenance Due  Topic Date Due   HIV Screening  Never done   Hepatitis C Screening  Never done   Zoster Vaccines- Shingrix (1 of 2) Never done   Colonoscopy  Never done   MAMMOGRAM  Never done   INFLUENZA VACCINE  08/05/2022     Assessment & Plan:   Problem List Items Addressed This Visit   None Visit Diagnoses     Allergic conjunctivitis of  right eye    -  Primary   Based ont he tiching, I suspect this was an allergic reaction. I recommend she use cool compresses for now. No need for antibiotics.   Screening for colon cancer       Patient requests screening.   Relevant Orders   Ambulatory referral to Gastroenterology       Return if symptoms worsen or fail to improve.   Loyola Mast, MD

## 2022-09-16 ENCOUNTER — Encounter: Payer: Self-pay | Admitting: Internal Medicine

## 2022-10-21 ENCOUNTER — Encounter: Payer: BC Managed Care – PPO | Admitting: Internal Medicine

## 2023-04-18 ENCOUNTER — Other Ambulatory Visit: Payer: Self-pay | Admitting: Family Medicine

## 2023-04-18 DIAGNOSIS — F341 Dysthymic disorder: Secondary | ICD-10-CM

## 2023-05-24 ENCOUNTER — Encounter: Payer: Self-pay | Admitting: Family Medicine

## 2023-05-24 ENCOUNTER — Ambulatory Visit: Admitting: Family Medicine

## 2023-05-24 VITALS — BP 114/72 | HR 86 | Temp 97.6°F | Ht 64.0 in | Wt 171.8 lb

## 2023-05-24 DIAGNOSIS — F341 Dysthymic disorder: Secondary | ICD-10-CM

## 2023-05-24 NOTE — Progress Notes (Signed)
 Established Patient Office Visit   Subjective:  Patient ID: Julie Hardy, female    DOB: Apr 03, 1970  Age: 53 y.o. MRN: [id number]  Chief Complaint  Patient presents with   Medical Management of Chronic Issues    Reestablih care. Pt was last seen 2 years ag with Dr. Tilmon Font.     HPI Encounter Diagnoses  Name Primary?   Dysthymia Yes   For follow-up of above.  Continues fluoxetine  20 mg daily.  She is taking it over the last 6 years.  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  She remains in the educational system.  Her work is far less stressful.  Things are well at home.  Her older daughter is working as a Runner, broadcasting/film/video.  Her younger daughter is finishing up eighth grade.  She is planning on starting back to the gym.  She is seeking my opinion about whether or not to continue with the fluoxetine .   Review of Systems  Constitutional: Negative.   HENT: Negative.    Eyes:  Negative for blurred vision, discharge and redness.  Respiratory: Negative.    Cardiovascular: Negative.   Gastrointestinal:  Negative for abdominal pain.  Genitourinary: Negative.   Musculoskeletal: Negative.  Negative for myalgias.  Skin:  Negative for rash.  Neurological:  Negative for tingling, loss of consciousness and weakness.  Endo/Heme/Allergies:  Negative for polydipsia.     Current Outpatient Medications:    b complex vitamins capsule, Take 1 capsule by mouth daily., Disp: , Rfl:    diclofenac  Sodium (VOLTAREN ) 1 % GEL, Apply a large pea sized dollop on tender spot of hands up to 4 times daily., Disp: 150 g, Rfl: 2   estradiol (CLIMARA - DOSED IN MG/24 HR) 0.0375 mg/24hr patch, Place 1 patch onto the skin once a week., Disp: , Rfl:    FLUoxetine  (PROZAC ) 20 MG capsule, TAKE 1 CAPSULE(20 MG) BY MOUTH DAILY, Disp: 90 capsule, Rfl: 1   ibuprofen  (ADVIL ) 600 MG tablet, Take 1 tablet (600 mg total) by mouth every 8 (eight) hours as needed., Disp: 30  tablet, Rfl: 0   levonorgestrel (MIRENA, 52 MG,) 20 MCG/24HR IUD, 1 each by Intrauterine route once. , Disp: , Rfl:    loratadine (CLARITIN) 10 MG tablet, Take 10 mg by mouth at bedtime., Disp: , Rfl:    Vitamin D , Cholecalciferol, 25 MCG (1000 UT) CAPS, Take 1 capsule by mouth daily in the afternoon., Disp: , Rfl:    Artificial Tear Ointment (DRY EYES OP), Place 1 drop into both eyes as needed. , Disp: , Rfl:    Objective:     BP 114/72 (Cuff Size: Normal)   Pulse 86   Temp 97.6 F (36.4 C) (Temporal)   Ht 5\' 4"  (1.626 m)   Wt 171 lb 12.8 oz (77.9 kg)   SpO2 99%   BMI 29.49 kg/m    Physical Exam Constitutional:      General: She is not in acute distress.    Appearance: Normal appearance. She is not ill-appearing, toxic-appearing or diaphoretic.  HENT:     Head: Normocephalic and atraumatic.     Right Ear: External ear normal.     Left Ear: External ear normal.  Eyes:     General: No scleral icterus.       Right eye: No discharge.        Left eye: No discharge.     Extraocular Movements: Extraocular movements intact.  Conjunctiva/sclera: Conjunctivae normal.  Pulmonary:     Effort: Pulmonary effort is normal. No respiratory distress.  Skin:    General: Skin is warm and dry.  Neurological:     Mental Status: She is alert and oriented to person, place, and time.  Psychiatric:        Mood and Affect: Mood normal.        Behavior: Behavior normal.      No results found for any visits on 05/24/23.    The ASCVD Risk score (Arnett DK, et al., 2019) failed to calculate for the following reasons:   Risk score cannot be calculated because patient has a medical history suggesting prior/existing ASCVD    Assessment & Plan:   Dysthymia    Return in about 6 weeks (around 07/05/2023), or Hold fluoxetine  and follow-up in 6 weeks fasting for physical, for annual physical.    Tonna Frederic, MD

## 2023-07-07 ENCOUNTER — Ambulatory Visit (INDEPENDENT_AMBULATORY_CARE_PROVIDER_SITE_OTHER): Admitting: Family Medicine

## 2023-07-07 ENCOUNTER — Ambulatory Visit: Payer: Self-pay | Admitting: Family Medicine

## 2023-07-07 ENCOUNTER — Encounter: Payer: Self-pay | Admitting: Family Medicine

## 2023-07-07 VITALS — BP 124/76 | HR 77 | Temp 96.9°F | Ht 64.0 in | Wt 169.6 lb

## 2023-07-07 DIAGNOSIS — Z0000 Encounter for general adult medical examination without abnormal findings: Secondary | ICD-10-CM

## 2023-07-07 DIAGNOSIS — Z131 Encounter for screening for diabetes mellitus: Secondary | ICD-10-CM

## 2023-07-07 DIAGNOSIS — R309 Painful micturition, unspecified: Secondary | ICD-10-CM

## 2023-07-07 DIAGNOSIS — Z13220 Encounter for screening for lipoid disorders: Secondary | ICD-10-CM | POA: Diagnosis not present

## 2023-07-07 DIAGNOSIS — Z5320 Procedure and treatment not carried out because of patient's decision for unspecified reasons: Secondary | ICD-10-CM

## 2023-07-07 DIAGNOSIS — Z1211 Encounter for screening for malignant neoplasm of colon: Secondary | ICD-10-CM

## 2023-07-07 LAB — LIPID PANEL
Cholesterol: 205 mg/dL — ABNORMAL HIGH (ref 0–200)
HDL: 52.8 mg/dL
LDL Cholesterol: 134 mg/dL — ABNORMAL HIGH (ref 0–99)
NonHDL: 151.88
Total CHOL/HDL Ratio: 4
Triglycerides: 88 mg/dL (ref 0.0–149.0)
VLDL: 17.6 mg/dL (ref 0.0–40.0)

## 2023-07-07 LAB — CBC WITH DIFFERENTIAL/PLATELET
Basophils Absolute: 0 10*3/uL (ref 0.0–0.1)
Basophils Relative: 0.6 % (ref 0.0–3.0)
Eosinophils Absolute: 0.2 10*3/uL (ref 0.0–0.7)
Eosinophils Relative: 2.6 % (ref 0.0–5.0)
HCT: 41.4 % (ref 36.0–46.0)
Hemoglobin: 14.2 g/dL (ref 12.0–15.0)
Lymphocytes Relative: 30.6 % (ref 12.0–46.0)
Lymphs Abs: 2.1 10*3/uL (ref 0.7–4.0)
MCHC: 34.3 g/dL (ref 30.0–36.0)
MCV: 92.7 fl (ref 78.0–100.0)
Monocytes Absolute: 0.4 10*3/uL (ref 0.1–1.0)
Monocytes Relative: 5.5 % (ref 3.0–12.0)
Neutro Abs: 4.2 10*3/uL (ref 1.4–7.7)
Neutrophils Relative %: 60.7 % (ref 43.0–77.0)
Platelets: 299 10*3/uL (ref 150.0–400.0)
RBC: 4.47 Mil/uL (ref 3.87–5.11)
RDW: 12.8 % (ref 11.5–15.5)
WBC: 6.9 10*3/uL (ref 4.0–10.5)

## 2023-07-07 LAB — URINALYSIS, ROUTINE W REFLEX MICROSCOPIC
Bilirubin Urine: NEGATIVE
Ketones, ur: NEGATIVE
Nitrite: NEGATIVE
Specific Gravity, Urine: 1.015 (ref 1.000–1.030)
Total Protein, Urine: NEGATIVE
Urine Glucose: NEGATIVE
Urobilinogen, UA: 0.2 (ref 0.0–1.0)
pH: 6 (ref 5.0–8.0)

## 2023-07-07 LAB — COMPREHENSIVE METABOLIC PANEL WITH GFR
ALT: 13 U/L (ref 0–35)
AST: 14 U/L (ref 0–37)
Albumin: 4.2 g/dL (ref 3.5–5.2)
Alkaline Phosphatase: 57 U/L (ref 39–117)
BUN: 11 mg/dL (ref 6–23)
CO2: 30 meq/L (ref 19–32)
Calcium: 9.1 mg/dL (ref 8.4–10.5)
Chloride: 104 meq/L (ref 96–112)
Creatinine, Ser: 0.66 mg/dL (ref 0.40–1.20)
GFR: 101.82 mL/min
Glucose, Bld: 84 mg/dL (ref 70–99)
Potassium: 4.2 meq/L (ref 3.5–5.1)
Sodium: 137 meq/L (ref 135–145)
Total Bilirubin: 0.5 mg/dL (ref 0.2–1.2)
Total Protein: 6.7 g/dL (ref 6.0–8.3)

## 2023-07-07 LAB — HEMOGLOBIN A1C: Hgb A1c MFr Bld: 5.4 % (ref 4.6–6.5)

## 2023-07-07 MED ORDER — SULFAMETHOXAZOLE-TRIMETHOPRIM 800-160 MG PO TABS: 1.0000 | ORAL_TABLET | Freq: Two times a day (BID) | ORAL | 0 refills | Status: AC

## 2023-07-07 NOTE — Progress Notes (Signed)
 Established Patient Office Visit   Subjective:  Patient ID: Julie Hardy, female    DOB: [birth year]  Age: 53 y.o. MRN: [id number]  Chief Complaint  Patient presents with   Annual Exam    CPE. Pt is fasting. Pap and Mam scheduled for next week at [facility].    HPI Encounter Diagnoses  Name Primary?   Healthcare maintenance Yes   Screening for cholesterol level    Screening for diabetes mellitus    Screening for hepatitis C declined    Screening for colon cancer    For physical exam.  Doing well.  Has done well status post discontinuation of fluoxetine .  Patient is walking for exercise.  She does have regular dental care.  She lives at home with her husband.  Has never had a colonoscopy.  She is seeing GYN for well woman care and mammogram.   Review of Systems  Constitutional: Negative.   HENT: Negative.    Eyes:  Negative for blurred vision, discharge and redness.  Respiratory: Negative.    Cardiovascular: Negative.   Gastrointestinal:  Negative for abdominal pain.  Genitourinary: Negative.   Musculoskeletal: Negative.  Negative for myalgias.  Skin:  Negative for rash.  Neurological:  Negative for tingling, loss of consciousness and weakness.  Endo/Heme/Allergies:  Negative for polydipsia.     Current Outpatient Medications:    b complex vitamins capsule, Take 1 capsule by mouth daily., Disp: , Rfl:    diclofenac  Sodium (VOLTAREN ) 1 % GEL, Apply a large pea sized dollop on tender spot of hands up to 4 times daily., Disp: 150 g, Rfl: 2   estradiol (CLIMARA - DOSED IN MG/24 HR) 0.0375 mg/24hr patch, Place 1 patch onto the skin once a week., Disp: , Rfl:    FLUoxetine  (PROZAC ) 20 MG capsule, TAKE 1 CAPSULE(20 MG) BY MOUTH DAILY, Disp: 90 capsule, Rfl: 1   ibuprofen  (ADVIL ) 600 MG tablet, Take 1 tablet (600 mg total) by mouth every 8 (eight) hours as needed., Disp: 30 tablet, Rfl: 0   levonorgestrel (MIRENA, 52 MG,) 20 MCG/24HR IUD, 1 each by Intrauterine route once.  , Disp: , Rfl:    loratadine (CLARITIN) 10 MG tablet, Take 10 mg by mouth at bedtime., Disp: , Rfl:    Vitamin D , Cholecalciferol, 25 MCG (1000 UT) CAPS, Take 1 capsule by mouth daily in the afternoon., Disp: , Rfl:    Objective:     BP 124/76 (BP Location: Right Arm, Patient Position: Sitting, Cuff Size: Normal)   Pulse 77   Temp (!) 96.9 F (36.1 C) (Temporal)   Ht 5' 4 (1.626 m)   Wt 169 lb 9.6 oz (76.9 kg)   SpO2 94%   BMI 29.11 kg/m    Physical Exam Constitutional:      General: She is not in acute distress.    Appearance: Normal appearance. She is not ill-appearing, toxic-appearing or diaphoretic.  HENT:     Head: Normocephalic and atraumatic.     Right Ear: Tympanic membrane, ear canal and external ear normal.     Left Ear: Tympanic membrane, ear canal and external ear normal.     Mouth/Throat:     Mouth: Mucous membranes are moist.     Pharynx: Oropharynx is clear. No oropharyngeal exudate or posterior oropharyngeal erythema.  Eyes:     General: No scleral icterus.       Right eye: No discharge.        Left eye: No discharge.  Extraocular Movements: Extraocular movements intact.     Conjunctiva/sclera: Conjunctivae normal.     Pupils: Pupils are equal, round, and reactive to light.  Cardiovascular:     Rate and Rhythm: Normal rate and regular rhythm.  Pulmonary:     Effort: Pulmonary effort is normal. No respiratory distress.     Breath sounds: Normal breath sounds.  Abdominal:     General: Bowel sounds are normal.  Musculoskeletal:     Cervical back: No rigidity or tenderness.     Right lower leg: No edema.     Left lower leg: No edema.  Skin:    General: Skin is warm and dry.  Neurological:     Mental Status: She is alert and oriented to person, place, and time.  Psychiatric:        Mood and Affect: Mood normal.        Behavior: Behavior normal.      No results found for any visits on 07/07/23.    The ASCVD Risk score (Arnett DK, et al.,  2019) failed to calculate for the following reasons:   Risk score cannot be calculated because patient has a medical history suggesting prior/existing ASCVD    Assessment & Plan:   Healthcare maintenance -     CBC with Differential/Platelet -     Urinalysis, Routine w reflex microscopic  Screening for cholesterol level -     Comprehensive metabolic panel with GFR -     Lipid panel  Screening for diabetes mellitus -     Comprehensive metabolic panel with GFR -     Hemoglobin A1c  Screening for hepatitis C declined  Screening for colon cancer -     Ambulatory referral to Gastroenterology    Return in about 1 year (around 07/06/2024), or if symptoms worsen or fail to improve.  Encouraged regular physical activity such as walking.  Agrees to go for first colonoscopy.  Continue regular dental care.  Information was given on health maintenance and disease prevention.  Elsie Sim Lent, MD

## 2023-08-23 ENCOUNTER — Encounter: Payer: Self-pay | Admitting: Family Medicine

## 2023-09-06 ENCOUNTER — Ambulatory Visit: Admitting: Family Medicine

## 2023-10-03 ENCOUNTER — Encounter: Payer: Self-pay | Admitting: Family Medicine

## 2024-01-02 ENCOUNTER — Encounter: Payer: Self-pay | Admitting: Emergency Medicine

## 2024-01-02 ENCOUNTER — Ambulatory Visit: Payer: Self-pay

## 2024-01-02 ENCOUNTER — Ambulatory Visit: Admitting: Emergency Medicine

## 2024-01-02 VITALS — BP 112/80 | HR 73 | Resp 16 | Ht 64.0 in | Wt 170.0 lb

## 2024-01-02 DIAGNOSIS — M7989 Other specified soft tissue disorders: Secondary | ICD-10-CM | POA: Diagnosis not present

## 2024-01-02 DIAGNOSIS — M79661 Pain in right lower leg: Secondary | ICD-10-CM

## 2024-01-02 NOTE — Progress Notes (Signed)
 "   Assessment & Plan:   Assessment & Plan Pain and swelling of right lower leg Most suspicious for overuse from modifying gait due to initial injury, poss radicular pain, but cannot r/o DVT given exam and risk factors. US , and if negative recommend steroid pack.  Orders:   US  Venous Img Lower Unilateral Right (DVT); Future     Julie Hardy, MSPAS, PA-C    Subjective:   Chief Complaint  Patient presents with   Leg Pain    Right leg, ankle and foot pain x 1 month. Dog hit her leg one month ago and has been sore since but it is worsening and swelling.     HPI: Julie Hardy is a 53 y.o. female presenting on 01/02/2024 with report of right leg pain.  66mo ago, 100lb dog ran into the inside of her calf, felt like it was bruised by only slight yellow coloring for a day or so. Since then, never back to 100% but did have some improvement until 5d ago. Leg feels heavy and sore, worse when she sits down after walking. Swells more when she is up walking on it, but the walking itself doesn't really produce increased pain. The heavy/uncomfortable sensation runs from inside of ankle, along inner lower leg and knee, to inner aspect of distal thigh. Denies color change to the leg, loss of sensation, or loss of strength.  No secondary injury. Denies recent heavy lifting or strenuous activity.  Does have a history of chronic back problems, but no recent new/increased back pain.  +estrogen use. Non-smoker. No DVT or PE hx. No CP or SOB.     ROS: Negative unless specifically indicated above in HPI.   Relevant past medical history reviewed and updated as indicated.   Allergies and medications reviewed and updated.  Current Medications[1]  Allergies[2]  Social History[3]   Objective:   Vitals:   01/02/24 1528  BP: 112/80  Pulse: 73  Resp: 16  Height: 5' 4 (1.626 m)  Weight: 170 lb (77.1 kg)  SpO2: 99%  BMI (Calculated): 29.17     Appears well, INAD Sclera  anicteric Oral mucosa moist Heart RRR Normal resp effort and excursion.  RLE examined, right calf symmetric with left, right ankle just under 1cm larger than left. Supple/soft compartments. +tenderness to palpation over the medial calf, medial ankle/distal lower leg, and medial knee without overlying skin discoloration. Minimal discomfort produced with Homans. Strong knee flex/ext and foot plantar/dorsi flex against resistance. 2+ DP pulse. Sensation intact to light touch throughout the foot and lower leg No lumbar spine tenderness to palpation     [1]  Current Outpatient Medications:    b complex vitamins capsule, Take 1 capsule by mouth daily., Disp: , Rfl:    estradiol (CLIMARA - DOSED IN MG/24 HR) 0.0375 mg/24hr patch, Place 1 patch onto the skin once a week., Disp: , Rfl:    ibuprofen  (ADVIL ) 600 MG tablet, Take 1 tablet (600 mg total) by mouth every 8 (eight) hours as needed., Disp: 30 tablet, Rfl: 0   levonorgestrel (MIRENA, 52 MG,) 20 MCG/24HR IUD, 1 each by Intrauterine route once. , Disp: , Rfl:    loratadine (CLARITIN) 10 MG tablet, Take 10 mg by mouth at bedtime., Disp: , Rfl:    Vitamin D , Cholecalciferol, 25 MCG (1000 UT) CAPS, Take 1 capsule by mouth daily in the afternoon., Disp: , Rfl:  [2]  Allergies Allergen Reactions   Amoxicillin Hives   Chocolate Flavoring Agent (Non-Screening) Other (  See Comments)    headache  [3]  Social History Tobacco Use   Smoking status: Never   Smokeless tobacco: Never  Vaping Use   Vaping status: Never Used  Substance Use Topics   Alcohol use: Yes    Alcohol/week: 7.0 standard drinks of alcohol    Types: 7 Glasses of wine per week    Comment: social   Drug use: No   "

## 2024-01-02 NOTE — Patient Instructions (Signed)
 We will call with ultrasound results and if it is normal plan for a steroid pack to calm down inflammation If at any time you develop chest pain or shortness of breath, go to the ER

## 2024-01-02 NOTE — Telephone Encounter (Signed)
 Noted. Patient was scheduled for an appointment for today with [name].

## 2024-01-02 NOTE — Telephone Encounter (Signed)
 FYI Only or Action Required?: FYI only for provider: appointment scheduled on 01/02/2024 at 3:40pm with [name] PA-C at PCP office .  Patient was last seen in primary care on 07/07/2023 by Berneta Elsie Sayre, MD.  Called Nurse Triage reporting Leg Swelling.  Symptoms began several days ago.  Interventions attempted: OTC medications: Advil  and Rest, hydration, or home remedies.  Symptoms are: gradually worsening.  Triage Disposition: See HCP Within 4 Hours (Or PCP Triage)  Patient/caregiver understands and will follow disposition?: Yes                Copied from CRM #[id number]. Topic: Clinical - Red Word Triage >> Jan 02, 2024 10:38 AM Lonell PEDLAR wrote: Red Word that prompted transfer to Nurse Triage: deep bruise inside of right leg, near calf, sore, tender to touch, swelling down to ankle, numbness at right calf and ankle Reason for Disposition  [1] Thigh or calf pain AND [2] only 1 side AND [3] present > 1 hour  Answer Assessment - Initial Assessment Questions A month ago patient's dog ran into her right leg and she felt like she had a bruise/tenderness on her right calf area For the past few days, tenderness in right calf area radiating down to ankle. Leg feels heavy and sore from calf to ankle When walking for a while, there will be swelling. Patient states no discoloration or redness noted Appointment made for this afternoon at patient's PCP office  Patient is advised to call us  back if anything changes or with any further questions/concerns. Patient is advised that if anything worsens to go to the Emergency Room. Patient verbalized understanding.     4. REDNESS: Is there redness or signs of infection?     denies 5. PAIN: Is the swelling painful to touch? If Yes, ask: How painful is it?   (Scale 1-10; mild, moderate or severe)     Dull aching 6. FEVER: Do you have a fever? If Yes, ask: What is it, how was it measured, and when did it start?       denies 7. CAUSE: What do you think is causing the leg swelling?     unknown 8. MEDICAL HISTORY: Do you have a history of blood clots (e.g., DVT), cancer, heart failure, kidney disease, or liver failure?     denies 9. RECURRENT SYMPTOM: Have you had leg swelling before? If Yes, ask: When was the last time? What happened that time?     No 10. OTHER SYMPTOMS: Do you have any other symptoms? (e.g., chest pain, difficulty breathing)       Pain and swelling 11. PREGNANCY: Is there any chance you are pregnant? When was your last menstrual period?       No  Protocols used: Leg Swelling and Edema-A-AH

## 2024-01-03 ENCOUNTER — Ambulatory Visit (HOSPITAL_COMMUNITY)
Admission: RE | Admit: 2024-01-03 | Discharge: 2024-01-03 | Disposition: A | Discharge status: Home / Self Care | Source: Ambulatory Visit | Attending: Emergency Medicine | Admitting: Emergency Medicine

## 2024-01-03 ENCOUNTER — Ambulatory Visit: Payer: Self-pay | Admitting: Emergency Medicine

## 2024-01-03 DIAGNOSIS — M79661 Pain in right lower leg: Secondary | ICD-10-CM | POA: Insufficient documentation

## 2024-01-03 DIAGNOSIS — M7989 Other specified soft tissue disorders: Secondary | ICD-10-CM | POA: Insufficient documentation

## 2024-01-03 MED ORDER — METHYLPREDNISOLONE 4 MG PO TBPK: ORAL_TABLET | ORAL | 0 refills | Status: AC

## 2024-01-04 ENCOUNTER — Ambulatory Visit (HOSPITAL_COMMUNITY)

## 2024-01-09 ENCOUNTER — Encounter

## 2024-01-09 DIAGNOSIS — J101 Influenza due to other identified influenza virus with other respiratory manifestations: Secondary | ICD-10-CM

## 2024-01-09 NOTE — Progress Notes (Signed)
 Error, needed for daughter.   This encounter was created in error - please disregard.

## 2024-01-11 ENCOUNTER — Telehealth: Admitting: Physician Assistant

## 2024-01-11 DIAGNOSIS — Z20828 Contact with and (suspected) exposure to other viral communicable diseases: Secondary | ICD-10-CM | POA: Diagnosis not present

## 2024-01-11 DIAGNOSIS — J101 Influenza due to other identified influenza virus with other respiratory manifestations: Secondary | ICD-10-CM | POA: Diagnosis not present

## 2024-01-11 MED ORDER — ONDANSETRON 4 MG PO TBDP: 4.0000 mg | ORAL_TABLET | Freq: Three times a day (TID) | ORAL | 0 refills | Status: AC | PRN

## 2024-01-11 MED ORDER — OSELTAMIVIR PHOSPHATE 75 MG PO CAPS: 75.0000 mg | ORAL_CAPSULE | Freq: Two times a day (BID) | ORAL | 0 refills | Status: AC

## 2024-01-11 MED ORDER — BENZONATATE 100 MG PO CAPS: 100.0000 mg | ORAL_CAPSULE | Freq: Three times a day (TID) | ORAL | 0 refills | Status: AC | PRN

## 2024-01-11 NOTE — Patient Instructions (Signed)
 " Julie Hardy, thank you for joining Delon CHRISTELLA Dickinson, PA-C for today's virtual visit.  While this provider is not your primary care provider (PCP), if your PCP is located in our provider database this encounter information will be shared with them immediately following your visit.   A [facility] MyChart account gives you access to today's visit and all your visits, tests, and labs performed at [facility]  click here if you don't have a [facility] MyChart account or go to [email or web address]  Consent: (Patient) Julie Hardy provided verbal consent for this virtual visit at the beginning of the encounter.  Current Medications:  Current Outpatient Medications:    benzonatate  (TESSALON ) 100 MG capsule, Take 1-2 capsules (100-200 mg total) by mouth 3 (three) times daily as needed., Disp: 30 capsule, Rfl: 0   ondansetron  (ZOFRAN -ODT) 4 MG disintegrating tablet, Take 1 tablet (4 mg total) by mouth every 8 (eight) hours as needed., Disp: 20 tablet, Rfl: 0   oseltamivir  (TAMIFLU ) 75 MG capsule, Take 1 capsule (75 mg total) by mouth 2 (two) times daily., Disp: 10 capsule, Rfl: 0   b complex vitamins capsule, Take 1 capsule by mouth daily., Disp: , Rfl:    estradiol (CLIMARA - DOSED IN MG/24 HR) 0.0375 mg/24hr patch, Place 1 patch onto the skin once a week., Disp: , Rfl:    ibuprofen  (ADVIL ) 600 MG tablet, Take 1 tablet (600 mg total) by mouth every 8 (eight) hours as needed., Disp: 30 tablet, Rfl: 0   levonorgestrel (MIRENA, 52 MG,) 20 MCG/24HR IUD, 1 each by Intrauterine route once. , Disp: , Rfl:    loratadine (CLARITIN) 10 MG tablet, Take 10 mg by mouth at bedtime., Disp: , Rfl:    methylPREDNISolone  (MEDROL  DOSEPAK) 4 MG TBPK tablet, Use as directed., Disp: 21 each, Rfl: 0   Vitamin D , Cholecalciferol, 25 MCG (1000 UT) CAPS, Take 1 capsule by mouth daily in the afternoon., Disp: , Rfl:    Medications ordered in this encounter:  Meds ordered this encounter   Medications   oseltamivir  (TAMIFLU ) 75 MG capsule    Sig: Take 1 capsule (75 mg total) by mouth 2 (two) times daily.    Dispense:  10 capsule    Refill:  0    Supervising Provider:   LAMPTEY, PHILIP O [[id number]]   ondansetron  (ZOFRAN -ODT) 4 MG disintegrating tablet    Sig: Take 1 tablet (4 mg total) by mouth every 8 (eight) hours as needed.    Dispense:  20 tablet    Refill:  0    Supervising Provider:   LAMPTEY, PHILIP O [[id number]]   benzonatate  (TESSALON ) 100 MG capsule    Sig: Take 1-2 capsules (100-200 mg total) by mouth 3 (three) times daily as needed.    Dispense:  30 capsule    Refill:  0    Supervising Provider:   BLAISE ALEENE KIDD [[id number]]     *If you need refills on other medications prior to your next appointment, please contact your pharmacy*  Follow-Up: Call back or seek an in-person evaluation if the symptoms worsen or if the condition fails to improve as anticipated.  [facility] [phone]  Other Instructions  Influenza, Adult Influenza is also called the flu. It's an infection that affects your respiratory tract. This includes your nose, throat, windpipe, and lungs. The flu is contagious. This means it spreads easily from person to person. It causes symptoms that are like a cold. It can also cause  a high fever and body aches. What are the causes? The flu is caused by the influenza virus. You can get it by: Breathing in droplets that are in the air after an infected person coughs or sneezes. Touching something that has the virus on it and then touching your mouth, nose, or eyes. What increases the risk? You may be more likely to get the flu if: You don't wash your hands often. You're near a lot of people during cold and flu season. You touch your mouth, eyes, or nose without washing your hands first. You don't get a flu shot each year. You may also be more at risk for the flu and serious problems, such as a lung infection called pneumonia,  if: You're older than 65. You're pregnant. Your immune system is weak. Your immune system is your body's defense system. You have a long-term, or chronic, condition, such as: Heart, kidney, or lung disease. Diabetes. A liver disorder. Asthma. You're very overweight. You have anemia. This is when you don't have enough red blood cells in your body. What are the signs or symptoms? Flu symptoms often start all of a sudden. They may last 4-14 days and include: Fever and chills. Headaches, body aches, or muscle aches. Sore throat. Cough. Runny or stuffy nose. Discomfort in your chest. Not wanting to eat as much as normal. Feeling weak or tired. Feeling dizzy. Nausea or vomiting. How is this diagnosed? The flu may be diagnosed based on your symptoms and medical history. You may also have a physical exam. A swab may be taken from your nose or throat and tested for the virus. How is this treated? If the flu is found early, you can be treated with antiviral medicine. This may be given to you by mouth or through an IV. It can help you feel less sick and get better faster. Taking care of yourself at home can also help your symptoms get better. Your health care provider may tell you to: Take over-the-counter medicines. Drink lots of fluids. The flu often goes away on its own. If you have very bad symptoms or problems caused by the flu, you may need to be treated in a hospital. Follow these instructions at home: Activity Rest as needed. Get lots of sleep. Stay home from work or school as told by your provider. Leave home only to go see your provider. Do not leave home for other reasons until you don't have a fever for 24 hours without taking medicine. Eating and drinking Take an oral rehydration solution (ORS). This is a drink that is sold at pharmacies and stores. Drink enough fluid to keep your pee pale yellow. Try to drink small amounts of clear fluids. These include water, ice chips,  fruit juice mixed with water, and low-calorie sports drinks. Try to eat bland foods that are easy to digest. These include bananas, applesauce, rice, lean meats, toast, and crackers. Avoid drinks that have a lot of sugar or caffeine  in them. These include energy drinks, regular sports drinks, and soda. Do not drink alcohol. Do not eat spicy or fatty foods. General instructions     Take your medicines only as told by your provider. Use a cool mist humidifier to add moisture to the air in your home. This can make it easier for you to breathe. You should also clean the humidifier every day. To do so: Empty the water. Pour clean water in. Cover your mouth and nose when you cough or sneeze. Wash your  hands with soap and water often and for at least 20 seconds. It's extra important to do so after you cough or sneeze. If you can't use soap and water, use hand sanitizer. How is this prevented?  Get a flu shot every year. Ask your provider when you should get your flu shot. Stay away from people who are sick during fall and winter. Fall and winter are cold and flu season. Contact a health care provider if: You get new symptoms. You have chest pain. You have watery poop, also called diarrhea. You have a fever. Your cough gets worse. You start to have more mucus. You feel like you may vomit, or you vomit. Get help right away if: You become short of breath or have trouble breathing. Your skin or nails turn blue. You have very bad pain or stiffness in your neck. You get a sudden headache or pain in your face or ear. You vomit each time you eat or drink. These symptoms may be an emergency. Call 911 right away. Do not wait to see if the symptoms will go away. Do not drive yourself to the hospital. This information is not intended to replace advice given to you by your health care provider. Make sure you discuss any questions you have with your health care provider. Document Revised: 09/23/2022  Document Reviewed: 01/28/2022 Elsevier Patient Education  2024 [employer]vier Inc.   If you have been instructed to have an in-person evaluation today at a local Urgent Care facility, please use the link below. It will take you to a list of all of our available [facility], including address, phone number and hours of operation. Please do not delay care.  [facility]  If you or a family member do not have a primary care provider, use the link below to schedule a visit and establish care. When you choose a [facility] primary care physician or advanced practice provider, you gain a long-term partner in health. Find a Primary Care Provider  Learn more about [facility]'s in-office and virtual care options: [facility] - Get Care Now "

## 2024-01-11 NOTE — Progress Notes (Signed)
 " Virtual Visit Consent   [name], you are scheduled for a virtual visit with a  provider today. Just as with appointments in the office, your consent must be obtained to participate. Your consent will be active for this visit and any virtual visit you may have with one of our providers in the next 365 days. If you have a MyChart account, a copy of this consent can be sent to you electronically.  As this is a virtual visit, video technology does not allow for your provider to perform a traditional examination. This may limit your provider's ability to fully assess your condition. If your provider identifies any concerns that need to be evaluated in person or the need to arrange testing (such as labs, EKG, etc.), we will make arrangements to do so. Although advances in technology are sophisticated, we cannot ensure that it will always work on either your end or our end. If the connection with a video visit is poor, the visit may have to be switched to a telephone visit. With either a video or telephone visit, we are not always able to ensure that we have a secure connection.  By engaging in this virtual visit, you consent to the provision of healthcare and authorize for your insurance to be billed (if applicable) for the services provided during this visit. Depending on your insurance coverage, you may receive a charge related to this service.  I need to obtain your verbal consent now. Are you willing to proceed with your visit today? BRITIANY SILBERNAGEL has provided verbal consent on 01/11/2024 for a virtual visit (video or telephone). Delon CHRISTELLA Dickinson, PA-C  Date: 01/11/2024 12:39 PM   Virtual Visit via Video Note   I, Delon CHRISTELLA Dickinson, connected with  Julie Hardy  ([id number], Aug 18, 1970) on 01/11/2024 at 12:30 PM EST by a video-enabled telemedicine application and verified that I am speaking with the correct person using two identifiers.  Location: Patient: Virtual  Visit Location Patient: Home Provider: Virtual Visit Location Provider: Home Office   I discussed the limitations of evaluation and management by telemedicine and the availability of in person appointments. The patient expressed understanding and agreed to proceed.    History of Present Illness: Julie Hardy is a 54 y.o. who identifies as a female who was assigned female at birth, and is being seen today for flu-like symptoms, positive exposure.  HPI: URI  This is a new problem. The current episode started yesterday. The problem has been unchanged. There has been no fever. Associated symptoms include chest pain (tightness), congestion, coughing, headaches, nausea and sinus pain (retro-orbital). Pertinent negatives include no ear pain, plugged ear sensation, rhinorrhea, sore throat, vomiting or wheezing. Associated symptoms comments: Fatigue, post nasal drainage. She has tried acetaminophen (tylenol, robitussin) for the symptoms. The treatment provided no relief.    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  Resting comfortably at home.  Head is normocephalic, atraumatic.  No labored breathing.   Speech is clear and coherent with logical content.  Patient is alert and oriented at baseline.    Assessment and Plan: 1. Influenza A (Primary) - oseltamivir  (TAMIFLU ) 75 MG capsule; Take 1 capsule (75 mg total) by mouth 2 (two) times daily.  Dispense: 10 capsule; Refill: 0 - ondansetron  (ZOFRAN -ODT) 4 MG disintegrating tablet; Take 1 tablet (4 mg total) by mouth every 8 (eight) hours as needed.  Dispense: 20 tablet; Refill: 0 - benzonatate  (TESSALON ) 100 MG capsule; Take 1-2 capsules (100-200 mg total) by mouth 3 (three) times daily as needed.  Dispense: 30 capsule; Refill: 0  2. Exposure to the flu  - Suspect influenza due to symptoms and positive exposure - Tamiflu  prescribed - Tessalon  perles for cough - Zofran  for nausea - Does have Promethazine DM cough syrup at home advised can use for nighttime cough - Continue OTC medication of choice for symptomatic management - Push fluids - Rest - Seek in person evaluation if symptoms worsen or fail to improve   Follow Up Instructions: I discussed the assessment and treatment plan with the patient. The patient was provided an opportunity to ask questions and all were answered. The patient agreed with the plan and demonstrated an understanding of the instructions.  A copy of instructions were sent to the patient via MyChart unless otherwise noted below.    The patient was advised to call back or seek an in-person evaluation if the symptoms worsen or if the condition fails to improve as anticipated.    Delon CHRISTELLA Dickinson, PA-C     [1]  Allergies Allergen Reactions   Amoxicillin Hives   Chocolate Flavoring Agent (Non-Screening) Other (See Comments)    headache  [2]  Current Outpatient Medications:    benzonatate  (TESSALON ) 100 MG capsule, Take 1-2 capsules (100-200 mg total) by mouth 3 (three) times daily as needed., Disp: 30 capsule, Rfl: 0   ondansetron  (ZOFRAN -ODT) 4 MG disintegrating tablet, Take 1 tablet (4 mg total) by  mouth every 8 (eight) hours as needed., Disp: 20 tablet, Rfl: 0   oseltamivir  (TAMIFLU ) 75 MG capsule, Take 1 capsule (75 mg total) by mouth 2 (two) times daily., Disp: 10 capsule, Rfl: 0   b complex vitamins capsule, Take 1 capsule by mouth daily., Disp: , Rfl:    estradiol (CLIMARA - DOSED IN MG/24 HR) 0.0375 mg/24hr patch, Place 1 patch onto the skin once a week., Disp: , Rfl:    ibuprofen  (ADVIL ) 600 MG tablet, Take 1 tablet (600 mg total) by mouth every 8 (eight) hours as needed., Disp: 30 tablet, Rfl: 0   levonorgestrel (MIRENA, 52 MG,) 20 MCG/24HR IUD, 1 each by Intrauterine route once. , Disp: , Rfl:    loratadine (CLARITIN) 10 MG tablet, Take 10 mg by mouth at bedtime., Disp: , Rfl:    methylPREDNISolone  (MEDROL  DOSEPAK) 4 MG TBPK tablet, Use as directed., Disp: 21 each, Rfl: 0   Vitamin D , Cholecalciferol, 25 MCG (1000 UT) CAPS, Take 1 capsule by mouth daily in the afternoon., Disp: , Rfl:   "

## 2024-02-07 NOTE — Telephone Encounter (Signed)
 Copied from CRM [id number]. Topic: Appointments - Scheduling Inquiry for Clinic >> Feb 07, 2024 11:15 AM Suzen RAMAN wrote: Reason for CRM: Patient is a previous patient of Dr. Berneta who is retiring soon and would like to transfer care to Dr. Geofm. Patient mother(Patricia C Jori) is currently establish with provider. Please contact patient once reviewed and approved by Dr. Geofm.    CB# [phone]

## 2024-02-09 NOTE — Telephone Encounter (Signed)
 Left message for patient to return call.

## 2024-02-10 ENCOUNTER — Telehealth: Payer: Self-pay

## 2024-02-10 NOTE — Telephone Encounter (Signed)
 Left message to for patient to contact Dr. Geofm office to follow up with establishing care.

## 2024-02-10 NOTE — Telephone Encounter (Signed)
 Copied from CRM [id number]. Topic: Appointments - Scheduling Inquiry for Clinic >> Feb 10, 2024 11:50 AM Kevelyn M wrote: Reason for CRM: patient is requesting to be a patient of Dr. Geofm. She is the daughter of Avelina Keel and sister to Ozell Keel.  Call back# [phone]
